# Patient Record
Sex: Female | Born: 1985 | Race: White | Hispanic: No | Marital: Married | Smoking: Never smoker
Health system: Southern US, Community
[De-identification: ages and names within clinical notes are randomized; demographics above are authoritative.]

## PROBLEM LIST (undated history)

## (undated) DIAGNOSIS — F419 Anxiety disorder, unspecified: Secondary | ICD-10-CM

## (undated) DIAGNOSIS — R41840 Attention and concentration deficit: Secondary | ICD-10-CM

## (undated) DIAGNOSIS — D696 Thrombocytopenia, unspecified: Secondary | ICD-10-CM

## (undated) DIAGNOSIS — B019 Varicella without complication: Secondary | ICD-10-CM

## (undated) DIAGNOSIS — J45909 Unspecified asthma, uncomplicated: Secondary | ICD-10-CM

## (undated) DIAGNOSIS — R61 Generalized hyperhidrosis: Secondary | ICD-10-CM

## (undated) HISTORY — DX: Attention and concentration deficit: R41.840

## (undated) HISTORY — DX: Varicella without complication: B01.9

## (undated) HISTORY — DX: Generalized hyperhidrosis: R61

## (undated) HISTORY — DX: Unspecified asthma, uncomplicated: J45.909

---

## 2009-04-19 ENCOUNTER — Ambulatory Visit: Payer: Self-pay | Admitting: Internal Medicine

## 2009-05-09 ENCOUNTER — Ambulatory Visit: Payer: Self-pay | Admitting: Internal Medicine

## 2009-05-20 ENCOUNTER — Ambulatory Visit: Payer: Self-pay | Admitting: Internal Medicine

## 2009-11-17 ENCOUNTER — Ambulatory Visit: Payer: Self-pay | Admitting: Internal Medicine

## 2010-12-06 ENCOUNTER — Emergency Department: Payer: Self-pay | Admitting: Emergency Medicine

## 2013-10-29 ENCOUNTER — Observation Stay: Payer: Self-pay

## 2013-10-29 LAB — URINALYSIS, COMPLETE
Bilirubin,UR: NEGATIVE
Ph: 6 (ref 4.5–8.0)
RBC,UR: 1 /HPF (ref 0–5)
Specific Gravity: 1.018 (ref 1.003–1.030)
Squamous Epithelial: 8

## 2013-10-29 LAB — CBC WITH DIFFERENTIAL/PLATELET
Basophil #: 0 10*3/uL (ref 0.0–0.1)
HCT: 34.8 % — ABNORMAL LOW (ref 35.0–47.0)
HGB: 12.1 g/dL (ref 12.0–16.0)
Lymphocyte #: 0.6 10*3/uL — ABNORMAL LOW (ref 1.0–3.6)
Lymphocyte %: 3.6 %
MCH: 29.6 pg (ref 26.0–34.0)
MCHC: 34.8 g/dL (ref 32.0–36.0)
Monocyte #: 0.6 x10 3/mm (ref 0.2–0.9)
Neutrophil #: 14.4 10*3/uL — ABNORMAL HIGH (ref 1.4–6.5)
RBC: 4.08 10*6/uL (ref 3.80–5.20)
RDW: 13.4 % (ref 11.5–14.5)

## 2013-10-29 LAB — ELECTROLYTE PANEL
Anion Gap: 8 (ref 7–16)
Chloride: 107 mmol/L (ref 98–107)
Potassium: 4.1 mmol/L (ref 3.5–5.1)

## 2013-11-02 ENCOUNTER — Inpatient Hospital Stay: Payer: Self-pay | Admitting: Obstetrics and Gynecology

## 2013-11-02 LAB — CBC WITH DIFFERENTIAL/PLATELET
Basophil #: 0 10*3/uL (ref 0.0–0.1)
Basophil %: 0.2 %
Eosinophil #: 0 10*3/uL (ref 0.0–0.7)
Eosinophil %: 0.3 %
HCT: 33.4 % — ABNORMAL LOW (ref 35.0–47.0)
HGB: 11.7 g/dL — ABNORMAL LOW (ref 12.0–16.0)
Lymphocyte #: 1.1 10*3/uL (ref 1.0–3.6)
MCH: 29.5 pg (ref 26.0–34.0)
MCHC: 35.1 g/dL (ref 32.0–36.0)
Monocyte %: 4.4 %
Neutrophil #: 10 10*3/uL — ABNORMAL HIGH (ref 1.4–6.5)
Neutrophil %: 86.1 %
RDW: 13.6 % (ref 11.5–14.5)
WBC: 11.7 10*3/uL — ABNORMAL HIGH (ref 3.6–11.0)

## 2013-11-02 LAB — PROTEIN / CREATININE RATIO, URINE
Creatinine, Urine: 293.5 mg/dL — ABNORMAL HIGH (ref 30.0–125.0)
Protein, Random Urine: 33 mg/dL — ABNORMAL HIGH (ref 0–12)
Protein/Creat. Ratio: 112 mg/gCREAT (ref 0–200)

## 2013-11-02 LAB — GC/CHLAMYDIA PROBE AMP

## 2013-11-03 LAB — PLATELET COUNT: Platelet: 92 10*3/uL — ABNORMAL LOW (ref 150–440)

## 2013-11-21 ENCOUNTER — Ambulatory Visit: Payer: Self-pay | Admitting: Internal Medicine

## 2013-11-22 ENCOUNTER — Ambulatory Visit: Payer: Self-pay | Admitting: Internal Medicine

## 2013-11-22 LAB — CBC CANCER CENTER
Basophil #: 0 x10 3/mm (ref 0.0–0.1)
Eosinophil #: 0.2 x10 3/mm (ref 0.0–0.7)
Eosinophil %: 3.3 %
HCT: 38.2 % (ref 35.0–47.0)
HGB: 12.5 g/dL (ref 12.0–16.0)
Lymphocyte #: 1.6 x10 3/mm (ref 1.0–3.6)
Lymphocyte %: 27.8 %
MCHC: 32.8 g/dL (ref 32.0–36.0)
Monocyte #: 0.4 x10 3/mm (ref 0.2–0.9)
Monocyte %: 6.1 %
Neutrophil #: 3.6 x10 3/mm (ref 1.4–6.5)
Platelet: 182 x10 3/mm (ref 150–440)
RDW: 13.1 % (ref 11.5–14.5)

## 2013-11-22 LAB — FERRITIN: Ferritin (ARMC): 33 ng/mL (ref 8–388)

## 2013-12-20 ENCOUNTER — Ambulatory Visit: Payer: Self-pay | Admitting: Internal Medicine

## 2015-04-11 NOTE — Op Note (Signed)
PATIENT NAME:  Natasha Yang, [name] MR#:  [id number] DATE OF BIRTH:  11-27-1986  DATE OF PROCEDURE:  11/03/2013  PREOPERATIVE DIAGNOSES:  1.  Term gestation. 2.  Thrombocytopenia. 3.  Unsuccessful induction with failure to progress.   POSTOPERATIVE DIAGNOSES: 1.  Term gestation. 2.  Thrombocytopenia. 3.  Unsuccessful induction with failure to progress.   PROCEDURE: Primary low transverse cesarean section.   SURGEON: Ricky L. Logan BoresEvans, MD  ASSISTANYetta Barre: Jones  ANESTHESIA: General by Dr. Henrene HawkingKephart  FINDINGS: Unremarkable abdomen, pelvis. Vigorous infant. Apgars 9 and 9, weight 7 pounds 7 ounces.   ESTIMATED BLOOD LOSS: 600.   COMPLICATIONS: None.   DRAINS: Foley.  On-Q pain pump placed.   Preoperative antibiotics given, 2 grams Ancef.   PROCEDURE IN DETAIL: After diagnosed and failure to progress discussed with patient and family, discussed situation and recommended cesarean section; pt and family stated understanding and desired to proceed. Consent signed. Foley catheter was placed.  DESCRIPTION OF PROCEDURE:  The patient was taken to the Operating Room and placed in the supine position, prepped and draped in the usual sterile fashion. After positioning and on okay from Anesthesia, on establishment of general anesthesia. Primary low transverse cesarean section was carried out in the usual fashion. Chromic interlocking on the uterus, rectus muscle hemostatic. The fascia was closed left to midline, On-Q pump catheters placed, hooked under the closed portion of the fascia. Remainder of the fascia was closed with simple running 0 Vicryl. Subcu was made  hemostatic with cautery. On-Q pump was then removed until the third hash mark was visualized. At this point,  the skin was closed with absorbable clips, and On-Q catheters were fixed in the usual fashion using skin glue, Steri-Strips and Tegaderm. Catheters were bolused with 5 mL of 0.5% Sensorcaine bilaterally, and dressing was applied. The  patient tolerated the procedure well. All instrument, needle, and sponge counts were correct. Anticipated routine postoperative course.    ____________________________ Reatha Harpsicky L. Logan BoresEvans, MD rle:mr D: 11/03/2013 20:25:29 ET T: 11/03/2013 22:01:46 ET JOB#: [id number]  cc: Clide Clifficky L. Logan BoresEvans, MD, <Dictator> Augustina MoodICK L Meridee Branum MD ELECTRONICALLY SIGNED 11/05/2013 12:08

## 2015-04-29 NOTE — H&P (Signed)
L&D Evaluation:  History:  HPI 29 yo G1P0 with LMP of 01/22/13 & EDD 10/30/13 with [facility]13 for Obesity (BMI 30), RH neg and pt just now realized that she had low platelets when she was 18. Pt had vomiting, diarrhea and fever yest of 101.4. Today she needed IV hydration and she is feeling well now.   Presents with nausea/vomiting   Patient's Medical History Low platelets age 29, +Chlamydia   Patient's Surgical History none   Medications Pre Natal Vitamins   Allergies NKDA   Social History none   Family History Non-Contributory   ROS:  ROS All systems were reviewed.  HEENT, CNS, GI, GU, Respiratory, CV, Renal and Musculoskeletal systems were found to be normal.   Exam:  Vital Signs stable   General no apparent distress   Mental Status clear   Chest clear   Heart normal sinus rhythm, no murmur/gallop/rubs   Abdomen gravid, non-tender   Estimated Fetal Weight Average for gestational age   Edema 1+   Reflexes 1+   Clonus negative   Pelvic cervix closed and thick   Mebranes Intact   FHT normal rate with no decels, reactive NST with 2 accels 15 x 15 bpm   Ucx regular   Skin dry   Lymph no lymphadenopathy   Other PLT's 83,000   Plan:  Plan fluids, [facility]   Electronic Signatures: Sharee PimpleJones, Anayansi Rundquist W (CNM)  (Signed [phone]-Nov-14 21:02)  Authored: L&D Evaluation   Last Updated: 10-Nov-14 21:02 by Sharee PimpleJones, Emonni Depasquale W (CNM)

## 2015-04-29 NOTE — H&P (Signed)
L&D Evaluation:  History:  HPI 29 yo G1P0 with LMP of 01/22/13 & EDd of 10/30/13 [facility] significant for post-dates (40 4/7 weeks) and thrombocytopenia recently presenting this week after pt had N,V,Diarrhea for 3 days. PT HERE  for IOL due to post-dates and low platelets. No ROM, VB, decreased FM or UC's at present.   Presents with IOL for post-dates and thrombocytopenia   Patient's Medical History Obesity, Chlamydia x 1 3 yrs ago   Patient's Surgical History none   Medications Pre Natal Vitamins   Allergies NKDA   Social History none   Family History Non-Contributory   ROS:  ROS All systems were reviewed.  HEENT, CNS, GI, GU, Respiratory, CV, Renal and Musculoskeletal systems were found to be normal.   Exam:  Vital Signs stable   General no apparent distress   Mental Status clear   Chest clear   Heart normal sinus rhythm, no murmur/gallop/rubs   Abdomen gravid, non-tender, EFW 8"6   Fetal Position vtx   Back no CVAT   Edema no edema   Reflexes 1+   Clonus negative   Pelvic closed/thick   Mebranes Intact   FHT normal rate with no decels, +accels, no decels   Ucx absent   Skin dry   Lymph no lymphadenopathy   Impression:  Impression IOL for post-dates   Plan:  Plan monitor contractions and for cervical change   Electronic Signatures: Sharee PimpleJones, Neale Marzette W (CNM)  (Signed [phone]-Nov-14 16:36)  Authored: L&D Evaluation   Last Updated: 14-Nov-14 16:36 by Sharee PimpleJones, Iyania Denne W (CNM)

## 2016-03-09 ENCOUNTER — Encounter: Payer: Self-pay | Admitting: Primary Care

## 2016-03-09 ENCOUNTER — Ambulatory Visit (INDEPENDENT_AMBULATORY_CARE_PROVIDER_SITE_OTHER): Payer: Managed Care, Other (non HMO) | Admitting: Primary Care

## 2016-03-09 ENCOUNTER — Ambulatory Visit (INDEPENDENT_AMBULATORY_CARE_PROVIDER_SITE_OTHER)
Admission: RE | Admit: 2016-03-09 | Discharge: 2016-03-09 | Disposition: A | Discharge status: Home / Self Care | Payer: Managed Care, Other (non HMO) | Source: Ambulatory Visit | Attending: Primary Care | Admitting: Primary Care

## 2016-03-09 VITALS — BP 136/84 | HR 64 | Temp 97.4°F | Ht 67.0 in | Wt 219.0 lb

## 2016-03-09 DIAGNOSIS — R61 Generalized hyperhidrosis: Secondary | ICD-10-CM | POA: Insufficient documentation

## 2016-03-09 DIAGNOSIS — R41840 Attention and concentration deficit: Secondary | ICD-10-CM | POA: Diagnosis not present

## 2016-03-09 DIAGNOSIS — M25512 Pain in left shoulder: Secondary | ICD-10-CM | POA: Diagnosis not present

## 2016-03-09 DIAGNOSIS — M7989 Other specified soft tissue disorders: Secondary | ICD-10-CM | POA: Diagnosis not present

## 2016-03-09 DIAGNOSIS — L74519 Primary focal hyperhidrosis, unspecified: Secondary | ICD-10-CM | POA: Diagnosis not present

## 2016-03-09 NOTE — Assessment & Plan Note (Signed)
Diagnosed years ago, once managed [facility] with B[facility]. Clammy hands with moderate swelling to hands and joints. Will rule out any inflammatory disorders and have her trial short term NSAIDS. Labs pending.

## 2016-03-09 NOTE — Assessment & Plan Note (Signed)
Present for 6+ months. Worse with abduction and rotation. Exam with moderate decrease in ROM. Will have her see Dr. Patsy Lageropland for evaluation. Xray today, pending. Will trial short term NSAIDS.

## 2016-03-09 NOTE — Progress Notes (Signed)
Pre visit review using our clinic review tool, if applicable. No additional management support is needed unless otherwise documented below in the visit note. 

## 2016-03-09 NOTE — Patient Instructions (Signed)
Complete lab work prior to leaving today. I will notify you of your results once received.   Complete xray(s) prior to leaving today. I will notify you of your results once received.  You will be contacted regarding your referral for ADD/ADHD testing.  Please let us know if you have not heard back within one week.   Schedule an appointment with Dr. Patsy Lageropland regarding your shoulder. You may take ibuprofen 600 mg three times daily as needed for pain and inflammation.  Please schedule a physical with me within the next 3-6 months. We will do same day labs if necessary.  It was a pleasure to meet you today! Please don't hesitate to call me with any questions. Welcome to [facility]!

## 2016-03-09 NOTE — Progress Notes (Signed)
Subjective:    Patient ID: Natasha Yang, female    DOB: [birth year], 31 y.o.   MRN: [id number]  HPI  Natasha Yang is a 30 year old female who presents today to establish care and discuss the problems mentioned below. Will obtain old records.  1) Shoulder Pain: Located to left shoulder, more internal pain that is present with rotation and abduction. Her pain will be present with removing her arm from a jacket. Her pain has been present for the past 6 months with an increase in pain during the summer last year while playing softball. She was once active in crossfit that required a lot of weight bearing exercises to her shoulder. She's not taken anything OTC for her pain. Overall her pain has progressed. She has not participated in softball or crossfit since last summer. Denies recent injury or trauma.  2) Hyperhydrosis: History of and was once treated at [facility]. Her hands will become swollen and clammy. She has pain and swelling to the joints of her fingers. She was once treated 7 years ago with Botox injections with temporary improvement.   3) Asthma: Diagnosed in childhood. Sports related. She has no symptoms as an adult and does not have an inhaler.   4) ADD: Diagnosed in 2012 when starting her undergraduate studies. She was provided with a test for ADD by her PCP and was managed on a medium dose Adderall with improvement in school work and focus. She was on this medication for 2 years. She is currently in graduate school since January 2017. She's noticed difficulty organizing thoughts in her brain, difficulty organizing and participating in group projects. She is interested in medication again as she was a better student. She's not had formal testing.  Review of Systems  Respiratory: Negative for shortness of breath and wheezing.   Genitourinary:       IUD  Musculoskeletal: Positive for joint swelling and arthralgias.  Skin:       Hyperhydrosis   Psychiatric/Behavioral: Positive for  decreased concentration.       Past Medical History  Diagnosis Date  . Asthma   . History of UTI   . Chickenpox     Social History   Social History  . Marital Status: Single    Spouse Name: N/A  . Number of Children: N/A  . Years of Education: N/A   Occupational History  . Not on file.   Social History Main Topics  . Smoking status: Never Smoker   . Smokeless tobacco: Not on file  . Alcohol Use: 0.0 oz/week    0 Standard drinks or equivalent per week     Comment: soical  . Drug Use: No  . Sexual Activity: Not on file   Other Topics Concern  . Not on file   Social History Narrative   Married.   1 child.   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   Currently Landscape architect for Social Work.    Enjoys spending time with family.    Past Surgical History  Procedure Laterality Date  . Cesarean section      Family History  Problem Relation Age of Onset  . Hypertension Mother   . Hypertension Father     No Known Allergies  No current outpatient prescriptions on file prior to visit.   No current facility-administered medications on file prior to visit.    BP 136/84 mmHg  Pulse 64  Temp(Src) 97.4 F (36.3 C) (Oral)  Ht  (1.702  m)  Wt 219 lb (99.338 kg)  BMI 34.29 kg/m2  SpO2 98%    Objective:   Physical Exam  Constitutional: She appears well-nourished.  Cardiovascular: Normal rate and regular rhythm.   Pulmonary/Chest: Effort normal and breath sounds normal. She has no wheezes.  Musculoskeletal:       Left shoulder: She exhibits decreased range of motion and pain. She exhibits no tenderness and no deformity.  Skin: Skin is warm.  Clammy to bilateral hands. Moderate swelling to hands and joints. 2+ radial pulses.  Psychiatric: She has a normal mood and affect.          Assessment & Plan:

## 2016-03-09 NOTE — Assessment & Plan Note (Signed)
Endorses history of ADD year ago. Managed on Adderall in the past with improvement. Will have her complete formal testing prior to re-initiation of meds. Referral placed.

## 2016-03-10 ENCOUNTER — Telehealth: Payer: Self-pay | Admitting: Primary Care

## 2016-03-10 LAB — RHEUMATOID FACTOR: Rhuematoid fact SerPl-aCnc: <10 IU/mL (ref <=14)

## 2016-03-10 LAB — ANA: Anti Nuclear Antibody(ANA): NEGATIVE

## 2016-03-10 LAB — SEDIMENTATION RATE: Sed Rate: 11 mm/hr (ref 0–22)

## 2016-03-10 NOTE — Telephone Encounter (Signed)
Pt called. Forgot to ask a what else she can do swelling in her hands. She knows you are waiting on labs but in the meantime what should she do? Is there anything else we can look into? Please advise

## 2016-03-10 NOTE — Telephone Encounter (Signed)
[facility] to be schedule with Dr. Reggy EyeAltabet or Dr. Jason FilaBray for formal ADD testing. Pt is aware their office will contact her to schedule.

## 2016-03-11 ENCOUNTER — Encounter: Payer: Self-pay | Admitting: Primary Care

## 2016-03-11 NOTE — Telephone Encounter (Signed)
Pt did view Kate's instructions on Mychart and I called and left voicemail also letting pt know Kate's comments and if they don't work to let us know because the next step would be HCTZ

## 2016-03-11 NOTE — Telephone Encounter (Signed)
I've sent the results of her recent testing to her My Chart account, she is negative for any inflammatory or arthritic disorders. Other than increasing consumption of water, decreasing salt consumption, and weight loss, we could try a low dose fluid pill called hydrochlorothiazide to help.  I would recommend she try the other suggestions first.

## 2016-03-13 ENCOUNTER — Other Ambulatory Visit: Payer: Self-pay | Admitting: Primary Care

## 2016-03-13 DIAGNOSIS — M7989 Other specified soft tissue disorders: Secondary | ICD-10-CM

## 2016-03-13 MED ORDER — HYDROCHLOROTHIAZIDE 12.5 MG PO CAPS: 12.5000 mg | ORAL_CAPSULE | Freq: Every day | ORAL | Status: DC

## 2016-03-17 ENCOUNTER — Ambulatory Visit (INDEPENDENT_AMBULATORY_CARE_PROVIDER_SITE_OTHER): Payer: Managed Care, Other (non HMO) | Admitting: Family Medicine

## 2016-03-17 ENCOUNTER — Encounter: Payer: Self-pay | Admitting: Family Medicine

## 2016-03-17 VITALS — BP 110/78 | HR 62 | Temp 98.2°F | Ht 67.0 in | Wt 222.0 lb

## 2016-03-17 DIAGNOSIS — M24512 Contracture, left shoulder: Secondary | ICD-10-CM

## 2016-03-17 DIAGNOSIS — M7582 Other shoulder lesions, left shoulder: Secondary | ICD-10-CM | POA: Diagnosis not present

## 2016-03-17 DIAGNOSIS — M25812 Other specified joint disorders, left shoulder: Secondary | ICD-10-CM

## 2016-03-17 DIAGNOSIS — M7522 Bicipital tendinitis, left shoulder: Secondary | ICD-10-CM | POA: Diagnosis not present

## 2016-03-17 NOTE — Progress Notes (Signed)
Dr. Karleen HampshireSpencer T. Jemima Petko, MD, CAQ Sports Medicine Primary Care and Sports Medicine [address] [place], [id number] Phone: [phone] Fax: [phone]  03/17/2016  Patient: Natasha Yang, MRN: [id number], DOB: 12/05/1986, [age] y.o.  Primary Physician:  Morrie Sheldonlark,Katherine Kendal, NP   Chief Complaint  Patient presents with  . Shoulder Pain    Left   Subjective:   Natasha DollyKelsey M Buchta is a 30 y.o. very pleasant female patient who presents with the following:  Consult: Clark  Pain last summer with playing softball. Hurts with softball. Moves certain ways and it will hurt a lot. If sitting here it will hurt a lot. Cannot sleep on it. Cannot bat. Only the left side.   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  Did crossfit for about 4 years. As far as she can remember,  She had no specific injury when she was doing CrossFit. No acute injury.   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 No lifting at work.  No repetitive motion that she would do at work.  She has never had a prior dislocation or subluxation.  No prior operations.  I reviewed her plain shoulder films face-to-face with the patient in the office.  They're essentially unremarkable.  There is no glenohumeral or acromioclavicular osteoarthritic change.  There is no evidence for prior trauma or prior fracture.  The humeral head is well located.  Past Medical History, Surgical History, Social History, Family History, Problem List, Medications, and Allergies have been reviewed and updated if relevant.  Patient Active Problem List   Diagnosis Date Noted  . Pain in joint of left shoulder 03/09/2016  . Difficulty concentrating 03/09/2016  . 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 Asthma   . Hyperhydrosis disorder   . Chickenpox   . Difficulty concentrating     Past Surgical History    Procedure Laterality Date  . Cesarean section      Social History   Social History  . Marital Status: Single    Spouse Name: N/A  . Number of Children: N/A  . Years of Education: N/A   Occupational History  . Not on file.   Social History Main Topics  . Smoking status: Never Smoker   . Smokeless tobacco: Never Used  . Alcohol Use: 0.0 oz/week    0 Standard drinks or equivalent per week     Comment: soical  . Drug Use: No  . Sexual Activity: Not on file   Other Topics Concern  . Not on file   Social History Narrative   Married.   1 child.   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   Currently Landscape architectgetting Masters for Social Work.    Enjoys spending time with family.    Family History  Problem Relation Age of Onset  . Hypertension Mother   . Hypertension Father     No Known Allergies  Medication list reviewed and updated in full in [facility].  GEN: No fevers, chills. Nontoxic. Primarily MSK c/o today. MSK: Detailed in the HPI GI: tolerating PO intake without difficulty Neuro: No numbness, parasthesias, or tingling associated. Otherwise the pertinent positives of the ROS are noted above.   Objective:   BP 110/78 mmHg  Pulse 62  Temp(Src) 98.2 F (36.8 C) (Oral)  Ht 5\' 7"  (1.702 m)  Wt 222 lb (100.699 kg)  BMI 34.76 kg/m2   GEN: WDWN, NAD, Non-toxic, Alert & Oriented x 3 HEENT: Atraumatic, Normocephalic.  Ears and Nose: No external deformity. EXTR: No clubbing/cyanosis/edema NEURO: Normal gait.  PSYCH: Normally interactive. Conversant. Not depressed or anxious appearing.  Calm demeanor.   Shoulder: L Inspection: No muscle wasting or winging Ecchymosis/edema: neg  AC joint, scapula, clavicle: NT Cervical spine: NT, full ROM Spurling's: neg Abduction: full, 5/5 Flexion: full, 5/5 IR, full, lift-off: 5/5 - at 90 deg of ABD, minimal to no IROM. 70 deg difference compared to R ER at neutral: full, 5/5 AC crossover and compression:  neg Neer: pos Hawkins: pos Drop Test: neg Empty Can: neg Supraspinatus insertion: NT Bicipital groove: TTP Speed's: pos Yergason's: neg Sulcus sign: neg Scapular dyskinesis: none C5-T1 intact Sensation intact Grip 5/5   Radiology: Dg Shoulder Left  03/10/2016  CLINICAL DATA:  Left shoulder pain for 6 months EXAM: LEFT SHOULDER - 2+ VIEW COMPARISON:  None. FINDINGS: There is no evidence of fracture or dislocation. There is no evidence of arthropathy or other focal bone abnormality. Soft tissues are unremarkable. IMPRESSION: Negative. Electronically Signed   By: Esperanza Heir M.D.   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  Nevertheless, these generally make each other worse significantly.  It is almost impossible to have normal shoulder motion and be without pain without at least some functional  Internal range of motion.  I gave her a posterior capsular program from [facility] and we are going to get her into formal physical therapy.  At age 69, I would expect that she will do well.  If she ultimately does poorly, other pathology such as labral tear cannot fully be excluded, and a MR arthrogram would be reasonable if the patient is amenable or would ever consider operative solution.  Follow-up: f/u 6 weeks  Orders Placed This Encounter  Procedures  .  Ambulatory referral to Physical Therapy    Signed,  Karleen Hampshire T. Mansel Strother, MD   Patient's Medications  New Prescriptions   No medications on file  Previous Medications   HYDROCHLOROTHIAZIDE (MICROZIDE) 12.5 MG CAPSULE    Take 1 capsule (12.5 mg total) by mouth daily.  Modified Medications   No medications on file  Discontinued Medications   No medications on file

## 2016-03-17 NOTE — Progress Notes (Signed)
Pre visit review using our clinic review tool, if applicable. No additional management support is needed unless otherwise documented below in the visit note. 

## 2016-04-06 ENCOUNTER — Telehealth: Payer: Managed Care, Other (non HMO) | Admitting: Family

## 2016-04-06 DIAGNOSIS — J309 Allergic rhinitis, unspecified: Secondary | ICD-10-CM

## 2016-04-06 MED ORDER — BENZONATATE 100 MG PO CAPS: 200.0000 mg | ORAL_CAPSULE | Freq: Three times a day (TID) | ORAL | Status: DC

## 2016-04-06 MED ORDER — AZITHROMYCIN 250 MG PO TABS: ORAL_TABLET | ORAL | Status: DC

## 2016-04-06 NOTE — Addendum Note (Signed)
Addended by: Worthy RancherWEBB, Carrell Palmatier B on: 04/06/2016 09:45 AM   Modules accepted: Orders

## 2016-04-06 NOTE — Addendum Note (Signed)
Addended by: Worthy RancherWEBB, Garmon Dehn B on: 04/06/2016 10:19 AM   Modules accepted: Orders

## 2016-04-06 NOTE — Progress Notes (Signed)
Although I do not this you need a zpak, I will send it. Zpaks are for bacterial infections not colds and virus. You have an allergy issue that has cased inflammation. You are welcome to take the Zpak but it does not cover colds. I will do a note as well.

## 2016-04-06 NOTE — Progress Notes (Signed)
E visit for Allergic Rhinitis We are sorry that you are not feeling well.  Her is how we plan to help!  Based on what you have shared with me it looks like you have Allergic Rhinitis.  Rhinitis is when a reaction occurs that causes nasal congestion, runny nose, sneezing, and itching.  Most types of rhinitis are caused by an inflammation and are associated with symptoms in the eyes ears or throat. There are several types of rhinitis.  The most common are acute rhinitis, which is usually caused by a viral illness, allergic or seasonal rhinitis, and nonallergic or year-round rhinitis.  Nasal allergies occur certain times of the year.  Allergic rhinitis is caused when allergens in the air trigger the release of histamine in the body.  Histamine causes itching, swelling, and fluid to build up in the fragile linings of the nasal passages, sinuses and eyelids.  An itchy nose and clear discharge are common.  I recommend the following over the counter treatments: You should take a daily dose of antihistamine  I also would recommend a nasal spray: Flonase 2 sprays into each nostril once daily. Both of these medications you can get over the counter at the pharmacy.     HOME CARE:   You can use an over-the-counter saline nasal spray as needed  Avoid areas where there is heavy dust, mites, or molds  Stay indoors on windy days during the pollen season  Keep windows closed in home, at least in bedroom; use air conditioner.  Use high-efficiency house air filter  Keep windows closed in car, turn AC on re-circulate  Avoid playing out with dog during pollen season  GET HELP RIGHT AWAY IF:   If your symptoms do not improve within 10 days  You become short of breath  You develop yellow or green discharge from your nose for over 3 days  You have coughing fits  MAKE SURE YOU:   Understand these instructions  Will watch your condition  Will get help right away if you are not doing well or  get worse  Thank you for choosing an e-visit. Your e-visit answers were reviewed by a board certified advanced clinical practitioner to complete your personal care plan. Depending upon the condition, your plan could have included both over the counter or prescription medications. Please review your pharmacy choice. Be sure that the pharmacy you have chosen is open so that you can pick up your prescription now.  If there is a problem you may message your provider in MyChart to have the prescription routed to another pharmacy. Your safety is important to us. If you have drug allergies check your prescription carefully.  For the next 24 hours, you can use MyChart to ask questions about today's visit, request a non-urgent call back, or ask for a work or school excuse from your e-visit provider. You will get an email in the next two days asking about your experience. I hope that your e-visit has been valuable and will speed your recovery.

## 2016-04-15 ENCOUNTER — Other Ambulatory Visit: Payer: Self-pay | Admitting: Primary Care

## 2016-04-15 NOTE — Telephone Encounter (Signed)
Electronically refill request for   hydrochlorothiazide (MICROZIDE) 12.5 MG capsule   Take 1 capsule (12.5 mg total) by mouth daily.  Dispense: 30 capsule   Refills: 0     Last prescribed on 03/13/2016. Last seen on 03/17/2016 for acute with Dr Patsy Lageropland. No future appointment.

## 2016-04-16 NOTE — Telephone Encounter (Signed)
Message left for patient to return my call.  

## 2016-04-20 ENCOUNTER — Telehealth: Payer: Self-pay

## 2016-04-20 NOTE — Telephone Encounter (Signed)
Pt left v/m; pt saw Dr Patsy Lageropland 03/17/16 and was referred to [facility]; Vonna KotykJay the physical therapist is not seeing much progress and was not sure if pt should be seen by Dr Patsy Lageropland or have referrral for pt to see orthopedist. Pt request cb.

## 2016-04-21 NOTE — Telephone Encounter (Signed)
Left message for Adelina MingsKelsey to return my call.

## 2016-04-21 NOTE — Telephone Encounter (Signed)
i had originally planned on her following up with me 6 weeks after I saw her. (ie. In the next week or so)  Typically, I would see my own patients to reevaluate them. If she would like to see a different physician, then this is always ok.

## 2016-04-21 NOTE — Telephone Encounter (Signed)
Pt returned you r call - please call at [phone] thanks

## 2016-04-21 NOTE — Telephone Encounter (Signed)
Lenox notified as instructed by telephone.  She wants to follow up with Dr. Patsy Lageropland.  Appointment scheduled for 04/29/2016 at 3:15 pm.

## 2016-04-29 ENCOUNTER — Ambulatory Visit (INDEPENDENT_AMBULATORY_CARE_PROVIDER_SITE_OTHER): Payer: Managed Care, Other (non HMO) | Admitting: Family Medicine

## 2016-04-29 ENCOUNTER — Encounter: Payer: Self-pay | Admitting: Family Medicine

## 2016-04-29 VITALS — BP 120/84 | HR 61 | Temp 98.3°F | Ht 67.0 in | Wt 225.2 lb

## 2016-04-29 DIAGNOSIS — L989 Disorder of the skin and subcutaneous tissue, unspecified: Secondary | ICD-10-CM | POA: Diagnosis not present

## 2016-04-29 DIAGNOSIS — M25512 Pain in left shoulder: Secondary | ICD-10-CM | POA: Diagnosis not present

## 2016-04-29 NOTE — Progress Notes (Signed)
Pre visit review using our clinic review tool, if applicable. No additional management support is needed unless otherwise documented below in the visit note. 

## 2016-04-29 NOTE — Progress Notes (Signed)
Dr. Karleen Hampshire T. Tylasia Fletchall, MD, CAQ Sports Medicine [facility] [address] [place], [address] Phone: [phone] Fax: [phone]  04/29/2016  Patient: Natasha Yang, MRN: [id number], DOB: [birth year], 30 y.o.  Primary Physician:  Morrie Sheldon, NP   Chief Complaint  Patient presents with  . Follow-up    Left shoulder   Subjective:   Natasha Yang is a 30 y.o. very pleasant female patient who presents with the following:  Pleasant patient who I remember well who presents with follow-up regarding left-sided shoulder pain.  On her last office visit details of her history are below, and at that point I thought that she was having some significant impingement and she also had some  GIRD (Glenohumeral Internal Rotational Deficiency).  At this point, on further questioning she also relates a history where she generally feels as if her shoulder is fine most of the time but there are times on specific movements where she feels as if she has a severe, 10 out of 10 pain, as if a knife is stabbing her in the shoulder.  It specifically hurts her in external range of motion and also when she is reaching across the body.  She has been doing formal physical therapy and rehabilitation on her own, and she does not feel like she is any better whatsoever.  She also has been doing oral anti-inflammatories.  No this is made any difference.  Electrical stimulation will help for a short amount of time, within the pain will return.  03/17/2016 Last OV with Hannah Beat, MD  Consult: Chestine Spore  Pain last summer with playing softball. Hurts with softball. Moves certain ways and it will hurt a lot. If sitting here it will hurt a lot. Cannot sleep on it. Cannot bat. Only the left side.   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  Did crossfit for about 4 years. As far as she can remember,   She had no specific injury when she was doing CrossFit. No acute injury.   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 No lifting at work.  No repetitive motion that she would do at work.  She has never had a prior dislocation or subluxation.  No prior operations.  I reviewed her plain shoulder films face-to-face with the patient in the office.  They're essentially unremarkable.  There is no glenohumeral or acromioclavicular osteoarthritic change.  There is no evidence for prior trauma or prior fracture.  The humeral head is well located.  Past Medical History, Surgical History, Social History, Family History, Problem List, Medications, and Allergies have been reviewed and updated if relevant.  Patient Active Problem List   Diagnosis Date Noted  . Pain in joint of left shoulder 03/09/2016  . Difficulty concentrating 03/09/2016  . 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 Asthma   . Hyperhydrosis disorder   . Chickenpox   . Difficulty concentrating     Past Surgical History  Procedure Laterality Date  . Cesarean section      Social History   Social History  . Marital Status: Single    Spouse Name: N/A  . Number of Children: N/A  . Years of Education: N/A   Occupational History  . Not on file.   Social History Main Topics  . Smoking status: Never Smoker   .  Smokeless tobacco: Never Used  . Alcohol Use: 0.0 oz/week    0 Standard drinks or equivalent per week     Comment: soical  . Drug Use: No  . Sexual Activity: Not on file   Other Topics Concern  . Not on file   Social History Narrative   Married.   1 child.   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   Currently Landscape architect for Social Work.    Enjoys spending time with family.    Family History  Problem Relation Age of Onset  . Hypertension Mother   . Hypertension Father     No Known Allergies  Medication list reviewed and updated in full in [facility].  GEN: No fevers, chills. Nontoxic. Primarily MSK c/o today. MSK: Detailed in the HPI GI: tolerating PO intake without difficulty Neuro: No numbness, parasthesias, or tingling associated. Otherwise the pertinent positives of the ROS are noted above.   Objective:   BP 120/84 mmHg  Pulse 61  Temp(Src) 98.3 F (36.8 C) (Oral)  Ht  (1.702 m)  Wt 225 lb 4 oz (102.173 kg)  BMI 35.27 kg/m2   GEN: WDWN, NAD, Non-toxic, Alert & Oriented x 3 HEENT: Atraumatic, Normocephalic.  Ears and Nose: No external deformity. EXTR: No clubbing/cyanosis/edema NEURO: Normal gait.  PSYCH: Normally interactive. Conversant. Not depressed or anxious appearing.  Calm demeanor.   Shoulder: L Inspection: No muscle wasting or winging Ecchymosis/edema: neg  AC joint, scapula, clavicle: NT Cervical spine: NT, full ROM Spurling's: neg Abduction: full, 5/5 Flexion: full, 5/5 IR, full, lift-off: 5/5 - at 90 deg of ABD, minimal to no IROM. 40 deg difference compared to R ER at neutral: full, 3+/5 AC crossover and compression: neg Neer: neg Hawkins: mild pos Drop Test: neg Empty Can: neg Supraspinatus insertion: NT Bicipital groove: TTP Speed's: pos Yergason's: neg Sulcus sign: neg O'BRIENS MARKEDLY POSITIVE JERK TEST POSITIVE Scapular dyskinesis: none C5-T1 intact Sensation intact Grip 5/5   Radiology: Dg Shoulder Left  03/10/2016  CLINICAL DATA:  Left shoulder pain for 6 months EXAM: LEFT SHOULDER - 2+ VIEW COMPARISON:  None. FINDINGS: There is no evidence of fracture or dislocation. There is no evidence of arthropathy or other focal bone abnormality. Soft tissues are unremarkable. IMPRESSION: Negative. Electronically Signed   By: Esperanza Heir M.D.   On: 03/10/2016 08:11   Assessment and Plan:   Left shoulder pain - Plan: DG FLUORO GUIDED NEEDLE PLC ASPIRATION/INJECTION LOC, Ambulatory referral to Orthopedic Surgery, MR Shoulder Left W Contrast, CANCELED: MR Shoulder Left Wo  Contrast  Skin lesion - Plan: Ambulatory referral to Dermatology  I'm convinced that the patient has got significant shoulder pathology.  Notable weakness in external rotation, cannot exclude rotator cuff tear.  With a dramatically positive O'Brien's test and a positive jerk test, labral tear seems to be the most likely pathology given physical examination and history.  Obtain an MR arthrogram of the left shoulder to evaluate for labral tear,  Capsular integrity, rotator cuff tear.  Failure of conservative management thus far for multiple months.  Given my high level of clinical concern, I'm going to have her go ahead and follow-up with  Dr. Ave Filter for his expertise.  I appreciate his assistance.  Orders Placed This Encounter  Procedures  . DG FLUORO GUIDED NEEDLE PLC ASPIRATION/INJECTION LOC  . MR Shoulder Left W Contrast  . Ambulatory referral to Dermatology  . Ambulatory referral to Orthopedic Surgery    Signed,  Karleen Hampshire  Ward Chatters. Raynesha Tiedt, MD   Patient's Medications  New Prescriptions   No medications on file  Previous Medications   HYDROCHLOROTHIAZIDE (MICROZIDE) 12.5 MG CAPSULE    TAKE 1 CAPSULE (12.5 MG TOTAL) BY MOUTH DAILY.  Modified Medications   No medications on file  Discontinued Medications   AZITHROMYCIN (ZITHROMAX) 250 MG TABLET    As directed (zpak)   BENZONATATE (TESSALON PERLES) 100 MG CAPSULE    Take 2 capsules (200 mg total) by mouth 3 (three) times daily.

## 2016-04-29 NOTE — Patient Instructions (Signed)

## 2016-05-07 ENCOUNTER — Ambulatory Visit
Admission: RE | Admit: 2016-05-07 | Discharge: 2016-05-07 | Disposition: A | Discharge status: Home / Self Care | Payer: Managed Care, Other (non HMO) | Source: Ambulatory Visit | Attending: Family Medicine | Admitting: Family Medicine

## 2016-05-07 DIAGNOSIS — M25512 Pain in left shoulder: Secondary | ICD-10-CM

## 2016-05-07 MED ORDER — IOPAMIDOL (ISOVUE-M 200) INJECTION 41%
18.0000 mL | Freq: Once | INTRAMUSCULAR | Status: AC
  Administered 2016-05-07: 18 mL via INTRA_ARTICULAR

## 2016-05-08 ENCOUNTER — Other Ambulatory Visit: Payer: Self-pay | Admitting: Primary Care

## 2016-05-10 NOTE — Telephone Encounter (Signed)
Yes, have her try taking 2 tablets for 2 weeks and call back with an update. Please have her e-mail me if she develops dizziness, lightheadedness, weakness.

## 2016-05-10 NOTE — Telephone Encounter (Signed)
Also have her monitor her BP if possible and notify me of readings below 100/60.

## 2016-05-10 NOTE — Telephone Encounter (Signed)
Electronically refill request for   hydrochlorothiazide (MICROZIDE) 12.5 MG capsule   TAKE 1 CAPSULE (12.5 MG TOTAL) BY MOUTH DAILY.  Dispense: 30 capsule   Refills: 0     Last prescribed on 04/15/2016. Last seen on 04/29/2016. Patient did not call back to update regarding swelling. No future appt

## 2016-05-10 NOTE — Telephone Encounter (Signed)
Spoken to patient and she stated that she did not send a request for refill. The prescription must be on auto refill. Patient stop taking it a while back when she did not see any differences on the swelling. Patient stated no, she have tried glycopyrrolate. Patient wanted to know if should increase the dosage of the HCTZ since noticed no change with 12.5 mg.

## 2016-05-11 NOTE — Telephone Encounter (Signed)
Spoken and notified patient of Kate's comments. Patient verbalized understanding. 

## 2016-05-11 NOTE — Telephone Encounter (Signed)
Message left for patient to return my call.  

## 2016-05-16 ENCOUNTER — Encounter: Payer: Self-pay | Admitting: Primary Care

## 2016-05-24 ENCOUNTER — Telehealth: Payer: Self-pay | Admitting: Primary Care

## 2016-05-24 NOTE — Telephone Encounter (Signed)
Message left for patient to return my call.  

## 2016-05-24 NOTE — Telephone Encounter (Signed)
-----   Message from Doreene NestKatherine K Calan Doren, NP sent at 05/10/2016  8:00 PM EDT ----- Regarding: Swelling Will you please check on Natasha Yang and her hand swelling. Any improvement on HCTZ, 2 tablets?

## 2016-05-25 ENCOUNTER — Encounter: Payer: Self-pay | Admitting: Primary Care

## 2016-05-25 NOTE — Telephone Encounter (Signed)
Patient send a MyChart message and it stated:  To be honest with you, I am not really noticing a difference. I typically take them in the morning....and for example, over this weekend by the evening time, my hands were swollen even with me taking the pills in the morning. Not sure if they 'wear' off? I havent really noticed a difference. & like I told the Doctor- I had tried water pills in the past (over the counter) and they never seemed to work. I notice that the swelling is intensified when I am hot and my hands are sweating. But over this weekend, they were severe and painful.

## 2016-05-25 NOTE — Telephone Encounter (Signed)
Attempted to call patient to discuss symptoms in greater detail. No answer, left voice mail message for her to return my call.

## 2016-06-03 ENCOUNTER — Other Ambulatory Visit: Payer: Self-pay | Admitting: Primary Care

## 2016-06-03 DIAGNOSIS — R61 Generalized hyperhidrosis: Secondary | ICD-10-CM

## 2016-06-10 ENCOUNTER — Other Ambulatory Visit: Payer: Self-pay | Admitting: Primary Care

## 2016-06-10 DIAGNOSIS — L74512 Primary focal hyperhidrosis, palms: Secondary | ICD-10-CM

## 2016-06-10 DIAGNOSIS — R61 Generalized hyperhidrosis: Secondary | ICD-10-CM

## 2016-08-01 IMAGING — MR MR SHOULDER*L* W/ CM
6 series · 38 of 40 positions shown · non-contrast
Comparison: 03/09/2016

CLINICAL DATA: Left shoulder pain for greater than 1 year.

EXAM:
MR ARTHROGRAM OF THE left SHOULDER
TECHNIQUE: Multiplanar, multisequence MR imaging of the left shoulder was
performed following the administration of intra-articular contrast.

[Series 3: T1 fat-sat · axial · 4.0mm · 0.23mm/px · z∈[-64,+21]mm · 9 of 20 slices shown (1 of 3)]
[im 1/20]
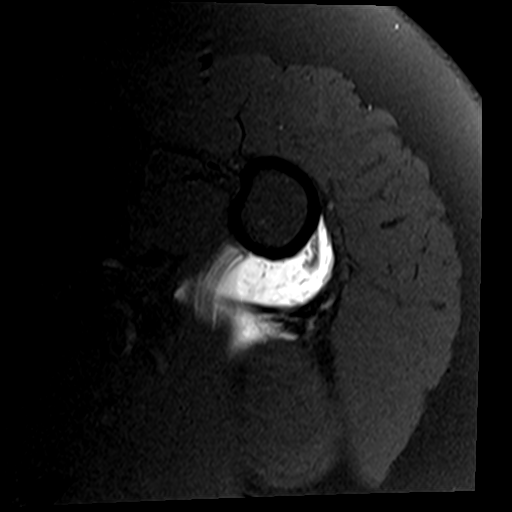
[im 3/20]
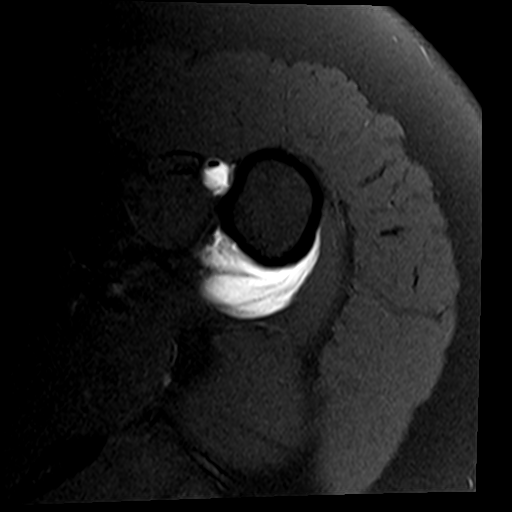
[im 5/20]
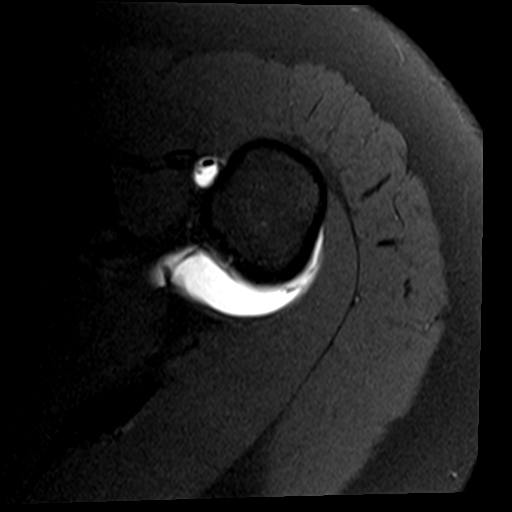
[im 8/20]
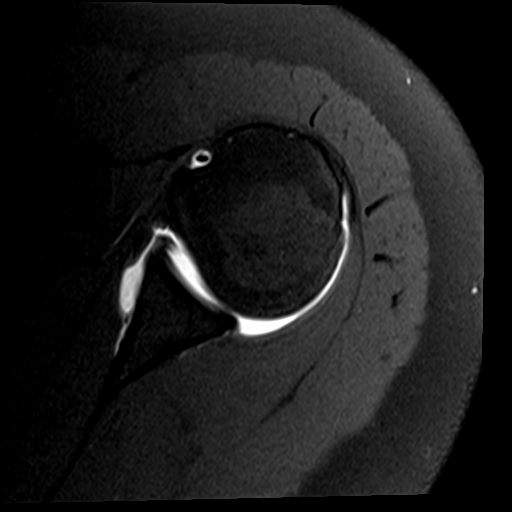
[im 10/20]
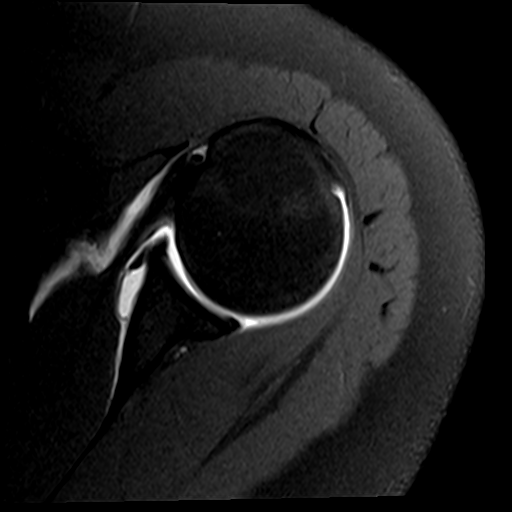
[im 12/20]
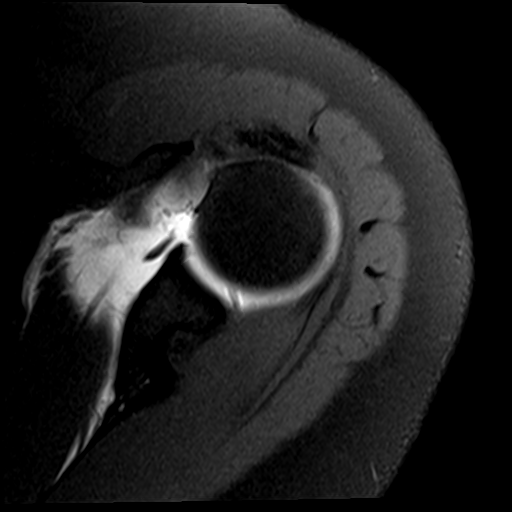
[im 15/20]
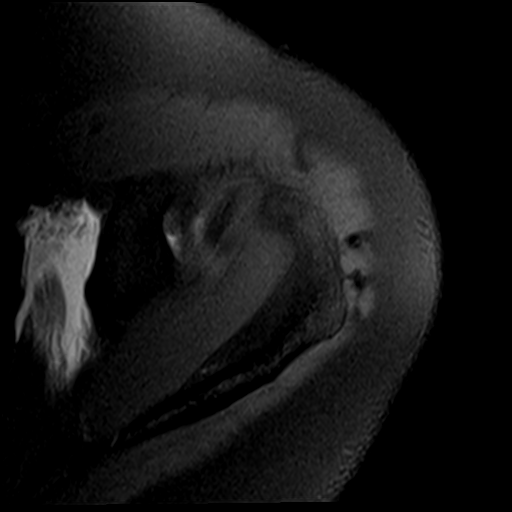
[im 17/20]
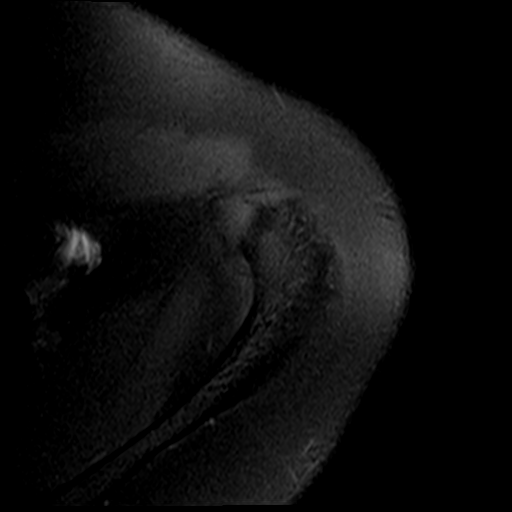
[im 20/20]
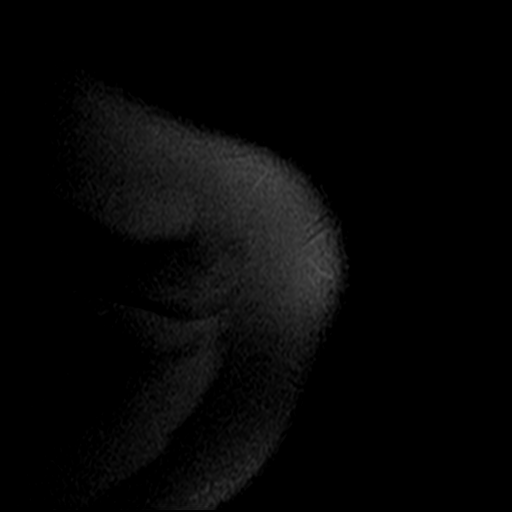

[Series 4: T2 fat-sat · oblique · 4.0mm · 0.55mm/px · 7 of 17 slices shown (1 of 2)]
[im 1/17]
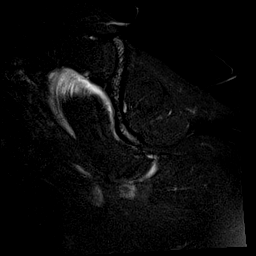
[im 3/17]
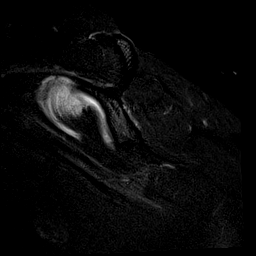
[im 6/17]
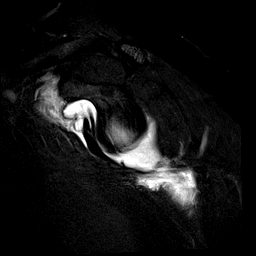
[im 9/17]
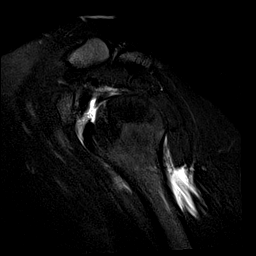
[im 11/17]
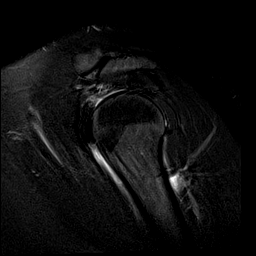
[im 14/17]
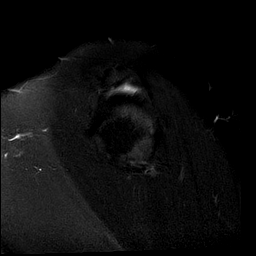
[im 17/17]
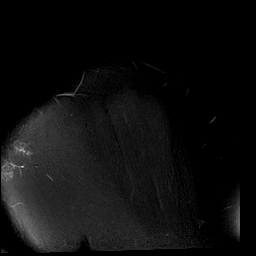

[Series 10: T1 · oblique · 4.0mm · 0.44mm/px · 6 of 16 slices shown]
[im 1/16]
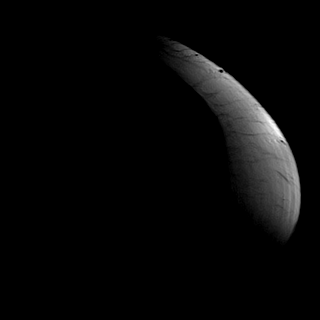
[im 4/16]
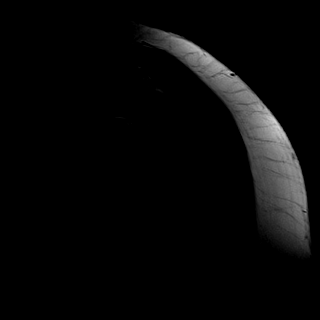
[im 7/16]
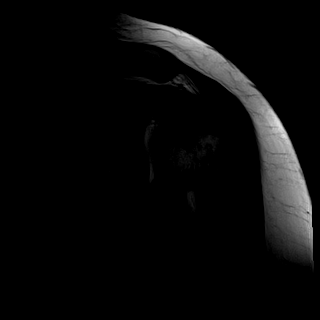
[im 10/16]
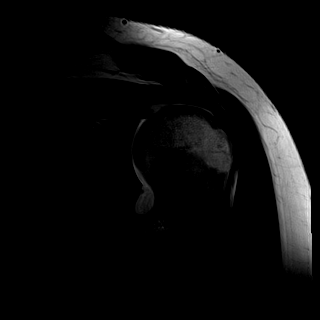
[im 13/16]
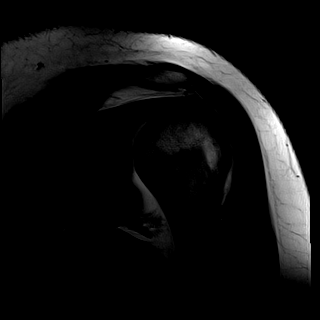
[im 16/16]
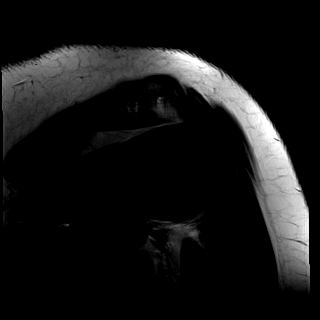

[Series 11: T1 fat-sat · oblique · 4.0mm · 0.55mm/px · 6 of 16 slices shown (2 of 3)]
[im 1/16]
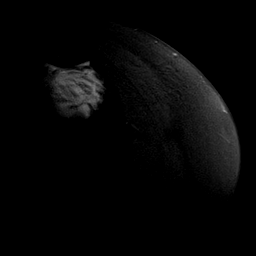
[im 4/16]
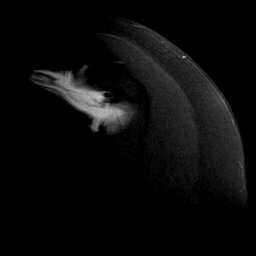
[im 7/16]
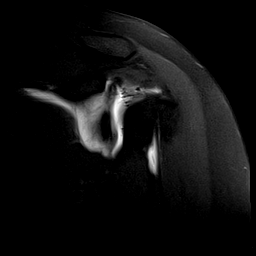
[im 10/16]
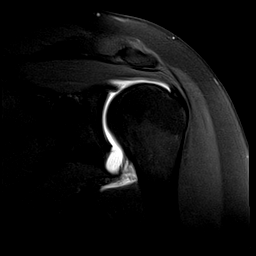
[im 13/16]
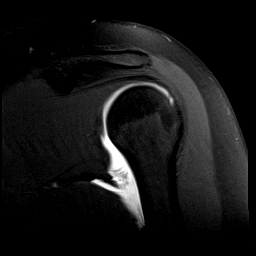
[im 16/16]
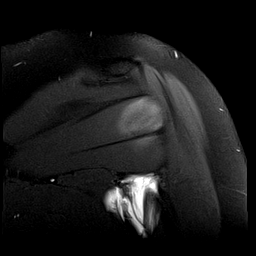

[Series 12: T2 fat-sat · oblique · 4.0mm · 0.55mm/px · 6 of 16 slices shown (2 of 2)]
[im 1/16]
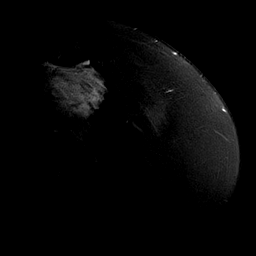
[im 4/16]
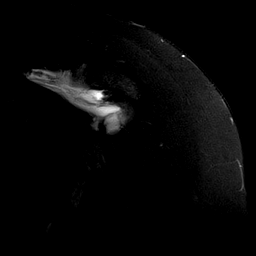
[im 7/16]
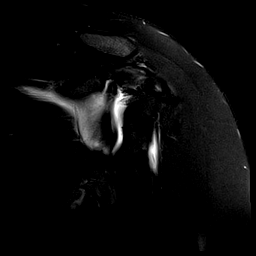
[im 10/16]
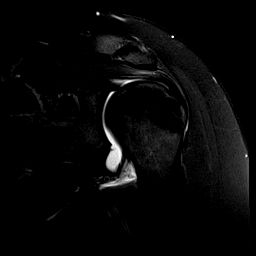
[im 13/16]
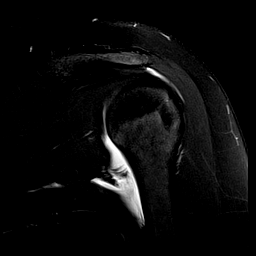
[im 16/16]
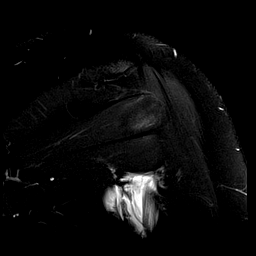

[Series 13: T1 fat-sat · sagittal · 4.0mm · 0.59mm/px · 4 of 16 slices shown (3 of 3)]
[im 1/16]
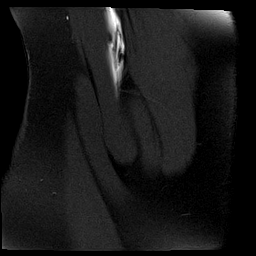
[im 4/16]
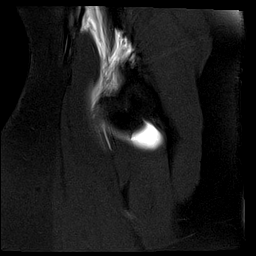
[im 7/16]
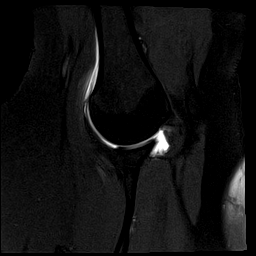
[im 10/16]
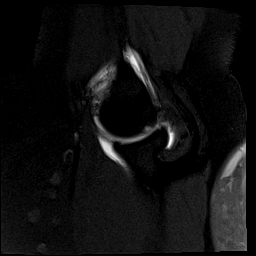

[38 of 40 positions shown; findings below may reference images not displayed]

FINDINGS: Rotator cuff: Moderate to prominent focal tendinopathy in the
supraspinatus tendon on image 9 series 12 with intrasubstance
tearing and some articular surface partial tearing. Given the lack
of contrast extension to the subacromial subdeltoid bursa this is
not thought represent full-thickness tearing. Distally near the
junction of the supraspinatus and infraspinatus tendons there is
partial thickness bursal surface tearing on image [DATE].

The subscapularis and teres minor tendons appear intact.

Muscles: Unremarkable

Biceps long head: Irregular and likely at least partially torn
proximal intra-articular segment.

Acromioclavicular Joint: Anteriorly downsloping acromion may
predispose to impingement. AC joint itself appears otherwise
unremarkable. There is mild subacromial subdeltoid bursitis.

Glenohumeral Joint: Ruptured posterior band of the inferior
glenohumeral ligament from the humerus, image [DATE], with contrast
leakage from the axillary pouch.

Articular cartilage unremarkable.

Labrum: Grossly unremarkable

Bones: No significant extra-articular osseous abnormalities
identified.
IMPRESSION: 1. The posterior band of the inferior glenohumeral ligament appears
to be torn away from the humerus, with resultant leakage of contrast
from the axillary pouch into the adjacent tissues.
2. Moderate to prominent focal tendinopathy in the supraspinatus
tendon in the critical zone. There is also some partial thickness
bursal surface tearing of the distal most supraspinatus tendon. No
full-thickness rotator cuff tear is identified.
3. Mild subacromial subdeltoid bursitis.
4. Irregular and somewhat diminutive proximal long head biceps
tendon, likely partially torn.

## 2016-11-04 ENCOUNTER — Encounter: Payer: Self-pay | Admitting: Primary Care

## 2016-11-23 ENCOUNTER — Encounter: Payer: Self-pay | Admitting: Primary Care

## 2016-11-23 ENCOUNTER — Ambulatory Visit (INDEPENDENT_AMBULATORY_CARE_PROVIDER_SITE_OTHER): Payer: Managed Care, Other (non HMO) | Admitting: Primary Care

## 2016-11-23 VITALS — BP 122/82 | HR 84 | Temp 98.2°F | Ht 67.0 in | Wt 236.8 lb

## 2016-11-23 DIAGNOSIS — R059 Cough, unspecified: Secondary | ICD-10-CM

## 2016-11-23 DIAGNOSIS — R05 Cough: Secondary | ICD-10-CM | POA: Diagnosis not present

## 2016-11-23 DIAGNOSIS — Z23 Encounter for immunization: Secondary | ICD-10-CM | POA: Diagnosis not present

## 2016-11-23 MED ORDER — ALBUTEROL SULFATE HFA 108 (90 BASE) MCG/ACT IN AERS: 2.0000 | INHALATION_SPRAY | Freq: Four times a day (QID) | RESPIRATORY_TRACT | 0 refills | Status: DC | PRN

## 2016-11-23 NOTE — Progress Notes (Signed)
Pre visit review using our clinic review tool, if applicable. No additional management support is needed unless otherwise documented below in the visit note. 

## 2016-11-23 NOTE — Patient Instructions (Signed)
Albuterol Inhaler: Inhale 2 puffs into the lungs every 6 to 8 hours as needed for wheezing, cough, and/or shortness of breath.   Start Zyrtec tablets every night at bedtime. Take this for the next 4 weeks, maybe longer.  Take a look at the triggers for acid reflux. Make sure you are not laying down within two hours after eating a meal.  Please notify me if no improvement by Monday next week.  It was a pleasure to see you today!   Food Choices for Gastroesophageal Reflux Disease, Adult When you have gastroesophageal reflux disease (GERD), the foods you eat and your eating habits are very important. Choosing the right foods can help ease your discomfort. What guidelines do I need to follow?  Choose fruits, vegetables, whole grains, and low-fat dairy products.  Choose low-fat meat, fish, and poultry.  Limit fats such as oils, salad dressings, butter, nuts, and avocado.  Keep a food diary. This helps you identify foods that cause symptoms.  Avoid foods that cause symptoms. These may be different for everyone.  Eat small meals often instead of 3 large meals a day.  Eat your meals slowly, in a place where you are relaxed.  Limit fried foods.  Cook foods using methods other than frying.  Avoid drinking alcohol.  Avoid drinking large amounts of liquids with your meals.  Avoid bending over or lying down until 2-3 hours after eating. What foods are not recommended? These are some foods and drinks that may make your symptoms worse: Vegetables  Tomatoes. Tomato juice. Tomato and spaghetti sauce. Chili peppers. Onion and garlic. Horseradish. Fruits  Oranges, grapefruit, and lemon (fruit and juice). Meats  High-fat meats, fish, and poultry. This includes hot dogs, ribs, ham, sausage, salami, and bacon. Dairy  Whole milk and chocolate milk. Sour cream. Cream. Butter. Ice cream. Cream cheese. Drinks  Coffee and tea. Bubbly (carbonated) drinks or energy drinks. Condiments  Hot  sauce. Barbecue sauce. Sweets/Desserts  Chocolate and cocoa. Donuts. Peppermint and spearmint. Fats and Oils  High-fat foods. This includes [place] fries and potato chips. Other  Vinegar. Strong spices. This includes black pepper, white pepper, red pepper, cayenne, curry powder, cloves, ginger, and chili powder. The items listed above may not be a complete list of foods and drinks to avoid. Contact your dietitian for more information.  This information is not intended to replace advice given to you by your health care provider. Make sure you discuss any questions you have with your health care provider. Document Released: 06/06/2012 Document Revised: 05/13/2016 Document Reviewed: 10/10/2013 Elsevier Interactive Patient Education  2017 [employer]

## 2016-11-23 NOTE — Progress Notes (Signed)
   Subjective:    Patient ID: Natasha Yang, female    DOB: 09/17/1986, 30 y.o.   MRN: [id number]  HPI  Natasha Yang is a 30 year old female with a history of asthma who presents today with a chief complaint of cough. Her asthma was diagnosed in childhood and has not been problematic during her adulthood as of yet. She has no albuterol inhaler at home.  Her cough has been present for the past 1-2 months. Her cough is worse in the afternoon and evening. She will experience coughing spells that will not improve with water and she will feel like she cannot catch her breath during these coughing spells. She has noticed some wheezing and epigastric tightness during the coughing spells. She denies esophageal burning, belching, abdominal pain, sneezing, itchy/watery eyes, fevers, fatigue. She's been taking Mucinex, sudafed, cough drops without any improvement.    Review of Systems  Constitutional: Negative for chills, fatigue and fever.  HENT: Negative for congestion, ear pain and sore throat.   Respiratory: Positive for cough, chest tightness and wheezing. Negative for shortness of breath.        Past Medical History:  Diagnosis Date  . Asthma   . Chickenpox   . Difficulty concentrating   . Hyperhydrosis disorder      Social History   Social History  . Marital status: Single    Spouse name: N/A  . Number of children: N/A  . Years of education: N/A   Occupational History  . Not on file.   Social History Main Topics  . Smoking status: Never Smoker  . Smokeless tobacco: Never Used  . Alcohol use 0.0 oz/week     Comment: soical  . Drug use: No  . Sexual activity: Not on file   Other Topics Concern  . Not on file   Social History Narrative   Married.   1 child.   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   Currently Landscape architectgetting Masters for Social Work.    Enjoys spending time with family.    Past Surgical History:  Procedure Laterality Date  . CESAREAN SECTION       Family History  Problem Relation Age of Onset  . Hypertension Mother   . Hypertension Father     No Known Allergies  No current outpatient prescriptions on file prior to visit.   No current facility-administered medications on file prior to visit.     BP 122/82   Pulse 84   Temp 98.2 F (36.8 C) (Oral)   Ht 5\' 7"  (1.702 m)   Wt 236 lb 12.8 oz (107.4 kg)   SpO2 97%   BMI 37.09 kg/m    Objective:   Physical Exam  Constitutional: She appears well-nourished.  HENT:  Right Ear: Tympanic membrane and ear canal normal.  Left Ear: Tympanic membrane and ear canal normal.  Nose: Right sinus exhibits no maxillary sinus tenderness and no frontal sinus tenderness. Left sinus exhibits no maxillary sinus tenderness and no frontal sinus tenderness.  Mouth/Throat: Oropharynx is clear and moist.  Eyes: Conjunctivae are normal.  Neck: Neck supple.  Cardiovascular: Normal rate and regular rhythm.   Pulmonary/Chest: Effort normal and breath sounds normal. She has no wheezes. She has no rales.  Dry cough noted during exam  Lymphadenopathy:    She has no cervical adenopathy.  Skin: Skin is warm and dry.          Assessment & Plan:

## 2016-11-23 NOTE — Addendum Note (Signed)
Addended by: Tawnya CrookSAMBATH, Rozelle Caudle on: 11/23/2016 01:04 PM   Modules accepted: Orders

## 2016-11-23 NOTE — Assessment & Plan Note (Signed)
Present for the past 1-2 months. History of childhood asthma, no problems during adulthood. Dry cough noted today. No evidence of viral/bacterial involvement. Differentials include asthma, GERD, allergy involvement.  Will start with albuterol inhaler PRN. Discussed to notify if no improvement by Monday next week. Also discussed to notify if she's requiring use of albuterol more than 3 times weekly. Start Zyrtec HS. If no improvement, will consider H2 blocker for GERD. Will consider spirometry next visit.

## 2017-09-14 ENCOUNTER — Telehealth: Payer: Self-pay | Admitting: Primary Care

## 2017-09-14 NOTE — Telephone Encounter (Signed)
Jae Dire is a great provider. I see no reason for her to need to switch.

## 2017-09-14 NOTE — Telephone Encounter (Signed)
Patient would like to switch from [name] to Natasha Yang.  Patient wants to switch due to preference. Patient is having lower abdomonel pain and would like to be seen next week. Patient said a detailed message can be left on her voice mail with response.

## 2017-09-27 ENCOUNTER — Encounter: Payer: Self-pay | Admitting: Physician Assistant

## 2017-09-27 ENCOUNTER — Ambulatory Visit (INDEPENDENT_AMBULATORY_CARE_PROVIDER_SITE_OTHER): Payer: Managed Care, Other (non HMO) | Admitting: Physician Assistant

## 2017-09-27 VITALS — BP 128/64 | HR 68 | Temp 98.6°F | Resp 16 | Ht 66.0 in | Wt 235.0 lb

## 2017-09-27 DIAGNOSIS — Z23 Encounter for immunization: Secondary | ICD-10-CM | POA: Diagnosis not present

## 2017-09-27 DIAGNOSIS — Z0000 Encounter for general adult medical examination without abnormal findings: Secondary | ICD-10-CM | POA: Diagnosis not present

## 2017-09-27 DIAGNOSIS — F411 Generalized anxiety disorder: Secondary | ICD-10-CM

## 2017-09-27 DIAGNOSIS — Z131 Encounter for screening for diabetes mellitus: Secondary | ICD-10-CM | POA: Diagnosis not present

## 2017-09-27 DIAGNOSIS — Z1329 Encounter for screening for other suspected endocrine disorder: Secondary | ICD-10-CM | POA: Diagnosis not present

## 2017-09-27 DIAGNOSIS — R1900 Intra-abdominal and pelvic swelling, mass and lump, unspecified site: Secondary | ICD-10-CM | POA: Diagnosis not present

## 2017-09-27 DIAGNOSIS — Z13220 Encounter for screening for lipoid disorders: Secondary | ICD-10-CM

## 2017-09-27 LAB — LIPID PANEL
Cholesterol: 155 mg/dL (ref <200)
HDL: 49 mg/dL — ABNORMAL LOW (ref >50)
LDL Cholesterol (Calc): 86 mg/dL (calc)
Non-HDL Cholesterol (Calc): 106 mg/dL (calc) (ref <130)
Total CHOL/HDL Ratio: 3.2 (calc) (ref <5.0)
Triglycerides: 100 mg/dL (ref <150)

## 2017-09-27 LAB — CBC WITH DIFFERENTIAL/PLATELET
Basophils Absolute: 50 cells/uL (ref 0–200)
Basophils Relative: 0.7 %
Eosinophils Absolute: 108 cells/uL (ref 15–500)
Eosinophils Relative: 1.5 %
HCT: 39.3 % (ref 35.0–45.0)
Hemoglobin: 13.3 g/dL (ref 11.7–15.5)
Lymphs Abs: 1944 cells/uL (ref 850–3900)
MCH: 28.7 pg (ref 27.0–33.0)
MCHC: 33.8 g/dL (ref 32.0–36.0)
MCV: 84.9 fL (ref 80.0–100.0)
MPV: 13 fL — ABNORMAL HIGH (ref 7.5–12.5)
Monocytes Relative: 6 %
Neutro Abs: 4666 cells/uL (ref 1500–7800)
Neutrophils Relative %: 64.8 %
Platelets: 168 10*3/uL (ref 140–400)
RBC: 4.63 10*6/uL (ref 3.80–5.10)
RDW: 12.3 % (ref 11.0–15.0)
Total Lymphocyte: 27 %
WBC mixed population: 432 cells/uL (ref 200–950)
WBC: 7.2 10*3/uL (ref 3.8–10.8)

## 2017-09-27 LAB — COMPLETE METABOLIC PANEL WITH GFR
AG Ratio: 1.9 (calc) (ref 1.0–2.5)
ALT: 11 U/L (ref 6–29)
AST: 13 U/L (ref 10–30)
Albumin: 4.3 g/dL (ref 3.6–5.1)
Alkaline phosphatase (APISO): 62 U/L (ref 33–115)
BUN: 10 mg/dL (ref 7–25)
CO2: 33 mmol/L — ABNORMAL HIGH (ref 20–32)
Calcium: 9.1 mg/dL (ref 8.6–10.2)
Chloride: 101 mmol/L (ref 98–110)
Creat: 0.74 mg/dL (ref 0.50–1.10)
GFR, Est African American: 127 mL/min/{1.73_m2} (ref >=60)
GFR, Est Non African American: 109 mL/min/{1.73_m2} (ref >=60)
Globulin: 2.3 g/dL (calc) (ref 1.9–3.7)
Glucose, Bld: 102 mg/dL — ABNORMAL HIGH (ref 65–99)
Potassium: 3.8 mmol/L (ref 3.5–5.3)
Sodium: 140 mmol/L (ref 135–146)
Total Bilirubin: 0.7 mg/dL (ref 0.2–1.2)
Total Protein: 6.6 g/dL (ref 6.1–8.1)

## 2017-09-27 LAB — TSH: TSH: 0.91 mIU/L

## 2017-09-27 NOTE — Progress Notes (Signed)
Patient: Natasha Yang Female    DOB: 01-07-1986   31 y.o.   MRN: [id number] Visit Date: 09/27/2017  Today's Provider: Trey Sailors, PA-C   Chief Complaint  Patient presents with  . Establish Care  . Abdominal Pain   Subjective:    Natasha Yang is a 31 y/o woman presenting today to establish care. She was previously seen by [facility].  She lives in [place] with her husband of 6 years and 77 year old daughter. Says her relationship is going well. She works for [employer] in [place]. She is sexually active, no concerns for STI. She has an IUD, does not have periods.  She sees Dr. Leeroy Bock Ward at [facility]. She has not had recent pap smear or HPV but reports she has upcoming appointment where this will be addressed. No history of abnormal PAP smears, did have HPV vaccination. History of breast cancer in paternal aunts age 53's and some maternal 2nd cousins. No history of colon cancer in family.  She was placed on HCTZ for intermittent swelling of her hands and legs. She notices swelling in her legs after long days of standing. Also had extreme swelling of hands, happens in hot weather.  She has Rx for Effexor, which she reports is for anxiety. She says she takes this as needed. She took it daily for seven days recently and felt poorly, nauseated and dizzy. She has an Rx for klonopin as well for events like flying and public speaking.   Was previously prescribed Phentermine , she has never taken this.   She also has history of right sided abdominal pain x 2 months. Intermittent pain, pulling sensation. No unintended weight loss, back pain, night sweats. Was evaluated by Dr. Elesa Massed in gynecology, thought to be possible scar tissue or fat hernia.   Pt comes today complaining of a lower right abdominal pain. She first noticed the pain about two months then she noticed a "lump" in the same area.  She stated the pain comes and goes but has become more frequent.     Abdominal Pain  This is a new problem. The current episode started more than 1 month ago. The onset quality is sudden. The problem occurs intermittently. The problem has been gradually worsening. Quality: "Pulling, throbbing, pressure" The abdominal pain does not radiate. Pertinent negatives include no constipation, diarrhea, nausea or vomiting.        No Known Allergies   Current Outpatient Prescriptions:  .  clonazePAM (KLONOPIN) 1 MG tablet, , Disp: , Rfl:  .  hydrochlorothiazide (HYDRODIURIL) 50 MG tablet, , Disp: , Rfl:  .  levonorgestrel (MIRENA) 20 MCG/24HR IUD, by Intrauterine route., Disp: , Rfl:  .  venlafaxine XR (EFFEXOR-XR) 37.5 MG 24 hr capsule, Take 37.5 mg by mouth daily with breakfast., Disp: , Rfl:   Review of Systems  Constitutional: Negative.   HENT: Negative.   Eyes: Negative.   Respiratory: Negative.   Cardiovascular: Negative.   Gastrointestinal: Positive for abdominal pain. Negative for abdominal distention, anal bleeding, blood in stool, constipation, diarrhea, nausea, rectal pain and vomiting.  Endocrine: Negative.   Genitourinary: Negative.   Musculoskeletal: Negative.   Skin: Negative.   Allergic/Immunologic: Negative.   Neurological: Negative.   Hematological: Negative.   Psychiatric/Behavioral: Negative.     Social History  Substance Use Topics  . Smoking status: Never Smoker  . Smokeless tobacco: Never Used  . Alcohol use 0.0 oz/week     Comment: soical  Objective:   BP 128/64 (BP Location: Left Arm, Patient Position: Sitting, Cuff Size: Large)   Pulse 68   Temp 98.6 F (37 C) (Oral)   Resp 16   Ht  (1.676 m)   Wt 235 lb (106.6 kg)   BMI 37.93 kg/m  Vitals:   09/27/17 0917  BP: 128/64  Pulse: 68  Resp: 16  Temp: 98.6 F (37 C)  TempSrc: Oral  Weight: 235 lb (106.6 kg)  Height:  (1.676 m)     Physical Exam  Constitutional: She is oriented to person, place, and time. She appears well-developed and  well-nourished.  Cardiovascular: Normal rate and regular rhythm.   Pulmonary/Chest: Effort normal and breath sounds normal.  Abdominal: Soft. Bowel sounds are normal. She exhibits no distension. There is no tenderness. There is no rebound.  Small 2 cm well circumscribed lesion in right pelvic area felt upon standing.  Neurological: She is alert and oriented to person, place, and time.  Skin: Skin is warm and dry.  Psychiatric: She has a normal mood and affect. Her behavior is normal.        Assessment & Plan:     1. Annual physical exam  - CBC with Differential  2. Screening cholesterol level  - Lipid panel  3. Thyroid disorder screening  - TSH  4. Diabetes mellitus screening  - Comprehensive metabolic panel  5. Generalized anxiety disorder  Needs to take effexor daily for it to be effective. Side effects should lessen with continue use.  6. Pelvic mass in female  Do agree likely scar tissue, may be possible hernia. Can be further evaluated by surgery. Low suspicion for malignancy.   - Ambulatory referral to General Surgery  7. Flu vaccine need  - Flu Vaccine QUAD 6+ mos PF IM (Fluarix Quad PF)  Return in about 6 months (around 03/28/2018) for anxiety follow up .  The entirety of the information documented in the History of Present Illness, Review of Systems and Physical Exam were personally obtained by me. Portions of this information were initially documented by Kavin Leech, CMA and reviewed by me for thoroughness and accuracy.        Trey Sailors, PA-C  [facility]

## 2017-09-27 NOTE — Patient Instructions (Addendum)

## 2017-09-29 ENCOUNTER — Ambulatory Visit (INDEPENDENT_AMBULATORY_CARE_PROVIDER_SITE_OTHER): Payer: Managed Care, Other (non HMO) | Admitting: General Surgery

## 2017-09-29 ENCOUNTER — Encounter: Payer: Self-pay | Admitting: General Surgery

## 2017-09-29 ENCOUNTER — Inpatient Hospital Stay: Payer: Self-pay

## 2017-09-29 VITALS — BP 132/88 | HR 94 | Resp 12 | Ht 66.0 in | Wt 237.4 lb

## 2017-09-29 DIAGNOSIS — N80129 Deep endometriosis of ovary, unspecified ovary: Secondary | ICD-10-CM

## 2017-09-29 DIAGNOSIS — R1903 Right lower quadrant abdominal swelling, mass and lump: Secondary | ICD-10-CM

## 2017-09-29 DIAGNOSIS — N809 Endometriosis, unspecified: Secondary | ICD-10-CM | POA: Insufficient documentation

## 2017-09-29 NOTE — Patient Instructions (Addendum)
The patient is aware to call back for any questions or concerns.  excision of endometriomas under Same Day Surgery

## 2017-09-29 NOTE — Progress Notes (Signed)
Patient ID: AXEL MEAS, female   DOB: [birth year], 31 y.o.   MRN: [id number]  Chief Complaint  Patient presents with  . Abdominal Pain    HPI Natasha Yang is a 31 y.o. female here today for a evaluation of a possible pelvic mass referred by Marykay Lex PA. She states her GYN [name]) completed a vaginal ultrasound which was negative. She has been having abdomidal pain for about few months now. The onset is sudden woke her up at night but seems to have gotten worse. It is described as a throbbing/pulling pressure. The pain does not radiate. The pain comes and goes. She can feel a knot right lower suprapubic near her C-section scar for about a month now. She does go to the gym but does not remember an incident of trauma. There is no cyclic tenderness. The patient makes use of an IUD for contraception.  HPI  Past Medical History:  Diagnosis Date  . Asthma   . Chickenpox   . Difficulty concentrating   . Hyperhydrosis disorder     Past Surgical History:  Procedure Laterality Date  . CESAREAN SECTION  2015    Family History  Problem Relation Age of Onset  . Hypertension Mother   . Hypertension Father   . Melanoma Father   . Healthy Sister   . Healthy Brother   . Breast cancer Paternal Aunt   . Colon cancer Neg Hx     Social History Social History  Substance Use Topics  . Smoking status: Never Smoker  . Smokeless tobacco: Never Used  . Alcohol use 0.0 oz/week     Comment: social    No Known Allergies  Current Outpatient Prescriptions  Medication Sig Dispense Refill  . clonazePAM (KLONOPIN) 1 MG tablet Take 1 mg by mouth as needed.     . hydrochlorothiazide (HYDRODIURIL) 50 MG tablet Take 50 mg by mouth daily.     Marland Kitchen levonorgestrel (MIRENA) 20 MCG/24HR IUD by Intrauterine route.    . venlafaxine XR (EFFEXOR-XR) 37.5 MG 24 hr capsule Take 37.5 mg by mouth daily with breakfast.     No current facility-administered medications for this visit.      Review of Systems Review of Systems  Constitutional: Negative.   Respiratory: Negative.   Cardiovascular: Negative.   Gastrointestinal: Positive for abdominal pain. Negative for diarrhea, nausea and vomiting.    Blood pressure 132/88, pulse 94, resp. rate 12, height  (1.676 m), weight 237 lb 6.4 oz (107.7 kg), SpO2 98 %.  Physical Exam Physical Exam  Constitutional: She is oriented to person, place, and time. She appears well-developed and well-nourished.  HENT:  Mouth/Throat: Oropharynx is clear and moist.  Eyes: Conjunctivae are normal. No scleral icterus.  Neck: Neck supple.  Cardiovascular: Normal rate, regular rhythm and normal heart sounds.   Pulmonary/Chest: Effort normal and breath sounds normal.  Abdominal: Soft. Normal appearance and bowel sounds are normal.    Lymphadenopathy:    She has no cervical adenopathy.  Neurological: She is alert and oriented to person, place, and time.  Skin: Skin is warm and dry.  Psychiatric: Her behavior is normal.    Data Reviewed Transvaginal ultrasound dated 07/25/2017 showed the IUD in place. No free fluid. Ovaries normal.  Ultrasound examination of the abdominal wall showed a hypoechoic mass above the rectus fascia measuring at a minimum 1.2 x 1.36 x 1.8 cm. This seemed to have a surrounding rim of edema. Medial to this a smaller, nonpalpable mass  measuring 0.3 x 1.1 x 1.14 cm is noted. This has similar characteristics with modest posterior acoustic enhancement. The dominant, palpable lesion show significant vascular flow. Findings are consistent with an endometrioma. No evidence of fascial defect to suggest hernia.  Laboratory studies dated 09/27/2017: CBC normal with a hemoglobin of 13.3 with an MCV of 85, white blood cell count 7200, platelet count of 168,000. Comprehensive metabolic panel the same date showed normal renal function and liver function studies. Estimated GFR 109.  Assessment    Abdominal wall  endometrioma, symptomatic.     Plan    Options for management include hormone suppression versus excision.    Discussed excision of endometriomas as an outpatient at Same Day Surgery.     HPI, Physical Exam, Assessment and Plan have been scribed under the direction and in the presence of Earline Mayotte, MD. Dorathy Daft, RN   Earline Mayotte 09/29/2017, 8:42 PM  Patient's surgery has been scheduled for 10-24-17 at [facility].   Nicholes Mango, CMA

## 2017-09-30 ENCOUNTER — Other Ambulatory Visit: Payer: Self-pay | Admitting: General Surgery

## 2017-10-10 ENCOUNTER — Encounter: Payer: Self-pay | Admitting: Physician Assistant

## 2017-10-17 ENCOUNTER — Encounter
Admission: RE | Admit: 2017-10-17 | Discharge: 2017-10-17 | Disposition: A | Discharge status: Home / Self Care | Payer: Managed Care, Other (non HMO) | Source: Ambulatory Visit | Attending: General Surgery | Admitting: General Surgery

## 2017-10-17 HISTORY — DX: Anxiety disorder, unspecified: F41.9

## 2017-10-17 HISTORY — DX: Thrombocytopenia, unspecified: D69.6

## 2017-10-17 NOTE — Patient Instructions (Addendum)
  Your procedure is scheduled on: 10-24-17 Report to Same Day Surgery 2nd floor medical mall [facility](Medical Mall Entrance-take elevator on left to 2nd floor.  Check in with surgery information desk.) To find out your arrival time please call [phone] between 1PM - 3PM on 10-21-17  Remember: Instructions that are not followed completely may result in serious medical risk, up to and including death, or upon the discretion of your surgeon and anesthesiologist your surgery may need to be rescheduled.    _x___ 1. Do not eat food after midnight the night before your procedure. NO GUM CHEWING OR HARD CANDIES.  You may drink clear liquids up to 2 hours before you are scheduled to arrive at the hospital for your procedure.  Do not drink clear liquids within 2 hours of your scheduled arrival to the hospital.  Clear liquids include  --Water or Apple juice without pulp  --Clear carbohydrate beverage such as ClearFast or Gatorade  --Black Coffee or Clear Tea (No milk, no creamers, do not add anything to the coffee or Tea)      __x__ 2. No Alcohol for 24 hours before or after surgery.   __x__3. No Smoking for 24 prior to surgery.   ____  4. Bring all medications with you on the day of surgery if instructed.    __x__ 5. Notify your doctor if there is any change in your medical condition     (cold, fever, infections).     Do not wear jewelry, make-up, hairpins, clips or nail polish.  Do not wear lotions, powders, or perfumes. You may wear deodorant.  Do not shave 48 hours prior to surgery. Men may shave face and neck.  Do not bring valuables to the hospital.    [facility] is not responsible for any belongings or valuables.               Contacts, dentures or bridgework may not be worn into surgery.  Leave your suitcase in the car. After surgery it may be brought to your room.  For patients admitted to the hospital, discharge time is determined by your  treatment team.   Patients discharged the day  of surgery will not be allowed to drive home.  You will need someone to drive you home and stay with you the night of your procedure.    Please read over the following fact sheets that you were given:      _x___ TAKE THE FOLLOWING MEDICATIONS THE MORNING OF SURGERY WITH A SMALL SIP OF WATER. These include:  1. EFFEXOR  2.  3.  4.  5.  6.  ____Fleets enema or Magnesium Citrate as directed.   _x___ Use CHG Soap or sage wipes as directed on instruction sheet   ____ Use inhalers on the day of surgery and bring to hospital day of surgery  ____ Stop Metformin and Janumet 2 days prior to surgery.    ____ Take 1/2 of usual insulin dose the night before surgery and none on the morning surgery.   ____ Follow recommendations from Cardiologist, Pulmonologist or PCP regarding stopping Aspirin, Coumadin, Plavix ,Eliquis, Effient, or Pradaxa, and Pletal.  ____Stop Anti-inflammatories such as Advil, Aleve, Ibuprofen, Motrin, Naproxen, Naprosyn, Goodies powders or aspirin products. OK to take Tylenol .   ____ Stop supplements until after surgery.     ____ Bring C-Pap to the hospital.

## 2017-10-24 ENCOUNTER — Other Ambulatory Visit: Payer: Self-pay

## 2017-10-24 ENCOUNTER — Encounter
Admission: RE | Disposition: A | Discharge status: Home / Self Care | Payer: Self-pay | Source: Ambulatory Visit | Attending: General Surgery

## 2017-10-24 ENCOUNTER — Encounter: Payer: Self-pay | Admitting: *Deleted

## 2017-10-24 ENCOUNTER — Ambulatory Visit: Payer: Managed Care, Other (non HMO) | Admitting: Anesthesiology

## 2017-10-24 ENCOUNTER — Ambulatory Visit
Admission: RE | Admit: 2017-10-24 | Discharge: 2017-10-24 | Disposition: A | Discharge status: Home / Self Care | Payer: Managed Care, Other (non HMO) | Source: Ambulatory Visit | Attending: General Surgery | Admitting: General Surgery

## 2017-10-24 DIAGNOSIS — I739 Peripheral vascular disease, unspecified: Secondary | ICD-10-CM | POA: Insufficient documentation

## 2017-10-24 DIAGNOSIS — N808 Other endometriosis: Secondary | ICD-10-CM | POA: Diagnosis present

## 2017-10-24 DIAGNOSIS — N809 Endometriosis, unspecified: Secondary | ICD-10-CM | POA: Diagnosis not present

## 2017-10-24 DIAGNOSIS — Z79899 Other long term (current) drug therapy: Secondary | ICD-10-CM | POA: Insufficient documentation

## 2017-10-24 DIAGNOSIS — N80129 Deep endometriosis of ovary, unspecified ovary: Secondary | ICD-10-CM

## 2017-10-24 HISTORY — PX: EXCISION OF ABDOMINAL WALL TUMOR: SHX6687

## 2017-10-24 LAB — POCT PREGNANCY, URINE: PREG TEST UR: NEGATIVE

## 2017-10-24 SURGERY — EXCISION, NEOPLASM, ABDOMINAL WALL
Anesthesia: General | Laterality: Right | Wound class: Clean

## 2017-10-24 MED ORDER — BUPIVACAINE HCL (PF) 0.5 % IJ SOLN
INTRAMUSCULAR | Status: AC
  Filled 2017-10-24: qty 30

## 2017-10-24 MED ORDER — LACTATED RINGERS IV SOLN
INTRAVENOUS | Status: DC
  Administered 2017-10-24 (×2): via INTRAVENOUS

## 2017-10-24 MED ORDER — HYDROCODONE-ACETAMINOPHEN 5-325 MG PO TABS
1.0000 | ORAL_TABLET | ORAL | Status: DC | PRN
Start: 2017-10-24 — End: 2017-10-24
  Administered 2017-10-24: 1 via ORAL

## 2017-10-24 MED ORDER — HYDROCODONE-ACETAMINOPHEN 5-325 MG PO TABS
ORAL_TABLET | ORAL | Status: AC
  Filled 2017-10-24: qty 1

## 2017-10-24 MED ORDER — FENTANYL CITRATE (PF) 100 MCG/2ML IJ SOLN
INTRAMUSCULAR | Status: AC
  Filled 2017-10-24: qty 2

## 2017-10-24 MED ORDER — PROPOFOL 10 MG/ML IV BOLUS
INTRAVENOUS | Status: DC | PRN
  Administered 2017-10-24: 140 mg via INTRAVENOUS

## 2017-10-24 MED ORDER — MIDAZOLAM HCL 2 MG/2ML IJ SOLN
INTRAMUSCULAR | Status: DC | PRN
  Administered 2017-10-24: 2 mg via INTRAVENOUS

## 2017-10-24 MED ORDER — FAMOTIDINE 20 MG PO TABS
ORAL_TABLET | ORAL | Status: AC
  Administered 2017-10-24: 20 mg via ORAL
  Filled 2017-10-24: qty 1

## 2017-10-24 MED ORDER — FENTANYL CITRATE (PF) 100 MCG/2ML IJ SOLN
25.0000 ug | INTRAMUSCULAR | Status: DC | PRN
  Administered 2017-10-24 (×5): 25 ug via INTRAVENOUS

## 2017-10-24 MED ORDER — KETOROLAC TROMETHAMINE 30 MG/ML IJ SOLN
INTRAMUSCULAR | Status: DC | PRN
  Administered 2017-10-24: 30 mg via INTRAVENOUS

## 2017-10-24 MED ORDER — HYDROCODONE-ACETAMINOPHEN 5-325 MG PO TABS: 1.0000 | ORAL_TABLET | ORAL | 0 refills | Status: DC | PRN

## 2017-10-24 MED ORDER — ONDANSETRON HCL 4 MG/2ML IJ SOLN
INTRAMUSCULAR | Status: DC | PRN
  Administered 2017-10-24: 4 mg via INTRAVENOUS

## 2017-10-24 MED ORDER — BUPIVACAINE-EPINEPHRINE (PF) 0.5% -1:200000 IJ SOLN
INTRAMUSCULAR | Status: AC
  Filled 2017-10-24: qty 30

## 2017-10-24 MED ORDER — BUPIVACAINE-EPINEPHRINE 0.5% -1:200000 IJ SOLN
INTRAMUSCULAR | Status: DC | PRN
  Administered 2017-10-24: 30 mL

## 2017-10-24 MED ORDER — MIDAZOLAM HCL 2 MG/2ML IJ SOLN
INTRAMUSCULAR | Status: AC
  Filled 2017-10-24: qty 2

## 2017-10-24 MED ORDER — ONDANSETRON HCL 4 MG/2ML IJ SOLN: 4.0000 mg | Freq: Once | INTRAMUSCULAR | Status: DC | PRN

## 2017-10-24 MED ORDER — FENTANYL CITRATE (PF) 100 MCG/2ML IJ SOLN
INTRAMUSCULAR | Status: DC | PRN
  Administered 2017-10-24 (×2): 50 ug via INTRAVENOUS

## 2017-10-24 MED ORDER — ACETAMINOPHEN 10 MG/ML IV SOLN
INTRAVENOUS | Status: DC | PRN
  Administered 2017-10-24: 1000 mg via INTRAVENOUS

## 2017-10-24 MED ORDER — FAMOTIDINE 20 MG PO TABS
20.0000 mg | ORAL_TABLET | Freq: Once | ORAL | Status: AC
  Administered 2017-10-24: 20 mg via ORAL

## 2017-10-24 MED ORDER — LIDOCAINE HCL (CARDIAC) 20 MG/ML IV SOLN
INTRAVENOUS | Status: DC | PRN
  Administered 2017-10-24: 50 mg via INTRAVENOUS

## 2017-10-24 MED ORDER — FENTANYL CITRATE (PF) 100 MCG/2ML IJ SOLN
INTRAMUSCULAR | Status: AC
  Administered 2017-10-24: 25 ug via INTRAVENOUS
  Filled 2017-10-24: qty 2

## 2017-10-24 SURGICAL SUPPLY — 23 items
CANISTER SUCT 1200ML W/VALVE (MISCELLANEOUS) ×3 IMPLANT
CHLORAPREP W/TINT 26ML (MISCELLANEOUS) ×3 IMPLANT
CLOSURE WOUND 1/2 X4 (GAUZE/BANDAGES/DRESSINGS) ×1
DRAPE LAPAROTOMY TRNSV 106X77 (MISCELLANEOUS) ×3 IMPLANT
DRSG TEGADERM 4X4.75 (GAUZE/BANDAGES/DRESSINGS) ×3 IMPLANT
DRSG TELFA 3X8 NADH (GAUZE/BANDAGES/DRESSINGS) ×3 IMPLANT
ELECT REM PT RETURN 9FT ADLT (ELECTROSURGICAL) ×3
ELECTRODE REM PT RTRN 9FT ADLT (ELECTROSURGICAL) ×1 IMPLANT
GLOVE BIO SURGEON STRL SZ7.5 (GLOVE) ×3 IMPLANT
GLOVE INDICATOR 8.0 STRL GRN (GLOVE) ×3 IMPLANT
GOWN STRL REUS W/ TWL LRG LVL3 (GOWN DISPOSABLE) ×2 IMPLANT
GOWN STRL REUS W/TWL LRG LVL3 (GOWN DISPOSABLE) ×4
KIT RM TURNOVER STRD PROC AR (KITS) ×3 IMPLANT
LABEL OR SOLS (LABEL) ×3 IMPLANT
NDL SAFETY 25GX1.5 (NEEDLE) ×3 IMPLANT
NS IRRIG 500ML POUR BTL (IV SOLUTION) ×3 IMPLANT
PACK BASIN MINOR ARMC (MISCELLANEOUS) ×3 IMPLANT
STRIP CLOSURE SKIN 1/2X4 (GAUZE/BANDAGES/DRESSINGS) ×2 IMPLANT
SUT VIC AB 3-0 SH 27 (SUTURE) ×2
SUT VIC AB 3-0 SH 27X BRD (SUTURE) ×1 IMPLANT
SUT VIC AB 4-0 FS2 27 (SUTURE) ×3 IMPLANT
SWABSTK COMLB BENZOIN TINCTURE (MISCELLANEOUS) ×3 IMPLANT
SYR CONTROL 10ML (SYRINGE) ×3 IMPLANT

## 2017-10-24 NOTE — Anesthesia Procedure Notes (Signed)
Procedure Name: LMA Insertion Date/Time: 10/24/2017 10:04 AM Performed by: Eduardo OsierKilduff, Amanada Philbrick, CRNA Pre-anesthesia Checklist: Patient identified, Emergency Drugs available, Suction available, Patient being monitored and Timeout performed Patient Re-evaluated:Patient Re-evaluated prior to induction Oxygen Delivery Method: Circle system utilized Preoxygenation: Pre-oxygenation with 100% oxygen Induction Type: IV induction LMA Size: 3.5 Number of attempts: 1 Placement Confirmation: breath sounds checked- equal and bilateral,  CO2 detector,  positive ETCO2 and ETT inserted through vocal cords under direct vision Tube secured with: Tape

## 2017-10-24 NOTE — Anesthesia Preprocedure Evaluation (Signed)
Anesthesia Evaluation  Patient identified by MRN, date of birth, ID band Patient awake    Reviewed: Allergy & Precautions, H&P , NPO status , Patient's Chart, lab work & pertinent test results, reviewed documented beta blocker date and time   Airway Mallampati: II  TM Distance: >3 FB Neck ROM: full    Dental  (+) Teeth Intact   Pulmonary neg pulmonary ROS, asthma ,    Pulmonary exam normal        Cardiovascular Exercise Tolerance: Good + Peripheral Vascular Disease  negative cardio ROS Normal cardiovascular exam Rate:Normal     Neuro/Psych negative neurological ROS  negative psych ROS   GI/Hepatic negative GI ROS, Neg liver ROS,   Endo/Other  negative endocrine ROS  Renal/GU negative Renal ROS  negative genitourinary   Musculoskeletal   Abdominal   Peds  Hematology negative hematology ROS (+)   Anesthesia Other Findings   Reproductive/Obstetrics negative OB ROS                             Anesthesia Physical Anesthesia Plan  ASA: II  Anesthesia Plan:    Post-op Pain Management:    Induction:   PONV Risk Score and Plan: 3 and Dexamethasone, Midazolam and Ondansetron  Airway Management Planned:   Additional Equipment:   Intra-op Plan:   Post-operative Plan:   Informed Consent: I have reviewed the patients History and Physical, chart, labs and discussed the procedure including the risks, benefits and alternatives for the proposed anesthesia with the patient or authorized representative who has indicated his/her understanding and acceptance.     Plan Discussed with: CRNA  Anesthesia Plan Comments:         Anesthesia Quick Evaluation

## 2017-10-24 NOTE — Anesthesia Post-op Follow-up Note (Signed)
Anesthesia QCDR form completed.        

## 2017-10-24 NOTE — Transfer of Care (Signed)
Immediate Anesthesia Transfer of Care Note  Patient: Natasha Yang  Procedure(s) Performed: EXCISION OF ABDOMINAL WALL ENDOMETRIUM (Right )  Patient Location: PACU  Anesthesia Type:General  Level of Consciousness: awake, alert  and oriented  Airway & Oxygen Therapy: Patient Spontanous Breathing and Patient connected to face mask oxygen  Post-op Assessment: Report given to RN  Post vital signs: Reviewed and stable  Last Vitals:  Vitals:   10/24/17 0909  BP: (!) 152/97  Pulse: 85  Resp: 18  Temp: 37.1 C  SpO2: 100%    Last Pain:  Vitals:   10/24/17 0909  TempSrc: Tympanic         Complications: No apparent anesthesia complications

## 2017-10-24 NOTE — Op Note (Signed)
Preoperative diagnosis: Suspected endometriomas of the lower abdominal wall.  Postoperative diagnosis: Same.  Operative procedure: Excision of abdominal wall endometriomas.  Operating Surgeon: Donnalee CurryJeffrey Johara Lodwick, MD.  Anesthesia: General by LMA, Marcaine 0.5% with 1-200,000 units of epinephrine, 30 cc.  Estimated blood loss: Less than 5 cc.  Clinical note: This 31 year old woman is developed a nodule in the lower abdominal wall.  She is 4 years status post emergency C-section.  Ultrasound was consistent with an endometrioma.  An additional nonpalpable lesion was identified medial to the index lesion in the lower abdomen.  She is admitted for elective excision.  Operative note: With the patient under adequate general anesthesia the abdomen was cleansed with ChloraPrep and draped.  Marcaine was infiltrated for postoperative analgesia.  A transverse incision was made over the masses that had been localized with ultrasound.  Ultrasound was used to identify the smaller medial lesion and both were marked.  A transverse incision was made over this area and the skin incised sharply and the remaining dissection completed with electrocautery.  The areas were excised down to and off the underlying abdominal wall fascia.  A clear border of normal adipose tissue was included.  The specimen was orientated with a short suture cephalad and a long suture laterally.  The specimen was sent in formalin for routine histology.  The adipose tissue was approximated with interrupted 2-0 Vicryl figure-of-eight sutures.  The superficial layer of adipose tissue was approximated with a running 3-0 Vicryl suture.  The skin was closed with a running 4-0 Vicryl subcuticular suture.  Benzoin, Steri-Strips, Telfa and Tegaderm dressing was applied.  The patient tolerated the procedure well and was taken to the recovery room in stable condition.

## 2017-10-24 NOTE — H&P (Signed)
No change in clinical history or exam.  For excision of abdominal wall endometriomas.

## 2017-10-24 NOTE — Discharge Instructions (Signed)
General Anesthesia, Adult, Care After °These instructions provide you with information about caring for yourself after your procedure. Your health care provider may also give you more specific instructions. Your treatment has been planned according to current medical practices, but problems sometimes occur. Call your health care provider if you have any problems or questions after your procedure. °What can I expect after the procedure? °After the procedure, it is common to have: °· Vomiting. °· A sore throat. °· Mental slowness. ° °It is common to feel: °· Nauseous. °· Cold or shivery. °· Sleepy. °· Tired. °· Sore or achy, even in parts of your body where you did not have surgery. ° °Follow these instructions at home: °For at least 24 hours after the procedure: °· Do not: °? Participate in activities where you could fall or become injured. °? Drive. °? Use heavy machinery. °? Drink alcohol. °? Take sleeping pills or medicines that cause drowsiness. °? Make important decisions or sign legal documents. °? Take care of children on your own. °· Rest. °Eating and drinking °· If you vomit, drink water, juice, or soup when you can drink without vomiting. °· Drink enough fluid to keep your urine clear or pale yellow. °· Make sure you have little or no nausea before eating solid foods. °· Follow the diet recommended by your health care provider. °General instructions °· Have a responsible adult stay with you until you are awake and alert. °· Return to your normal activities as told by your health care provider. Ask your health care provider what activities are safe for you. °· Take over-the-counter and prescription medicines only as told by your health care provider. °· If you smoke, do not smoke without supervision. °· Keep all follow-up visits as told by your health care provider. This is important. °Contact a health care provider if: °· You continue to have nausea or vomiting at home, and medicines are not helpful. °· You  cannot drink fluids or start eating again. °· You cannot urinate after 8-12 hours. °· You develop a skin rash. °· You have fever. °· You have increasing redness at the site of your procedure. °Get help right away if: °· You have difficulty breathing. °· You have chest pain. °· You have unexpected bleeding. °· You feel that you are having a life-threatening or urgent problem. °This information is not intended to replace advice given to you by your health care provider. Make sure you discuss any questions you have with your health care provider. °Document Released: 03/14/2001 Document Revised: 05/10/2016 Document Reviewed: 11/20/2015 °Elsevier Interactive Patient Education © 2018 [employer] ° °

## 2017-10-25 LAB — SURGICAL PATHOLOGY

## 2017-10-26 ENCOUNTER — Telehealth: Payer: Self-pay

## 2017-10-26 NOTE — Telephone Encounter (Signed)
-----   Message from Earline MayotteJeffrey W Byrnett, MD sent at 10/26/2017  8:56 AM EST ----- Please notify the patient path as expected: endometriomas.  F/U as planned for post op check. Thanks.  ----- Message ----- From: Interface, Lab In Three Zero One Sent: 10/25/2017   2:38 PM To: Earline MayotteJeffrey W Byrnett, MD

## 2017-10-26 NOTE — Telephone Encounter (Signed)
Notified patient as instructed, patient pleased. Discussed follow-up appointments, patient agrees  

## 2017-11-02 ENCOUNTER — Encounter: Payer: Self-pay | Admitting: General Surgery

## 2017-11-02 ENCOUNTER — Ambulatory Visit (INDEPENDENT_AMBULATORY_CARE_PROVIDER_SITE_OTHER): Payer: Managed Care, Other (non HMO) | Admitting: General Surgery

## 2017-11-02 VITALS — BP 120/80 | HR 82 | Resp 12 | Ht 66.0 in | Wt 244.0 lb

## 2017-11-02 DIAGNOSIS — N809 Endometriosis, unspecified: Secondary | ICD-10-CM

## 2017-11-02 DIAGNOSIS — N80129 Deep endometriosis of ovary, unspecified ovary: Secondary | ICD-10-CM

## 2017-11-02 NOTE — Patient Instructions (Signed)
Patient to return as needed. May massage the area. The patient is aware to call back for any questions or concerns.

## 2017-11-02 NOTE — Progress Notes (Signed)
Patient ID: Natasha DollyKelsey M Wingrove, female   DOB: 04/02/1986, 31 y.o.   MRN: [id number]  Chief Complaint  Patient presents with  . Routine Post Op    HPI Natasha Yang is a 31 y.o. female here today for her post op excision abdominal wall done 10/24/2017. Patient states she is doing well.  HPI  Past Medical History:  Diagnosis Date  . Anxiety   . Asthma    AS A CHILD -NO INHALERS  . Chickenpox   . Difficulty concentrating   . Hyperhydrosis disorder   . Thrombocytopenia (HCC)    DURING PREGNANCY IN 2015-RESOLVED AFTER DELIVERY    Past Surgical History:  Procedure Laterality Date  . CESAREAN SECTION  2015    Family History  Problem Relation Age of Onset  . Hypertension Mother   . Hypertension Father   . Melanoma Father   . Healthy Sister   . Healthy Brother   . Breast cancer Paternal Aunt   . Colon cancer Neg Hx     Social History Social History   Tobacco Use  . Smoking status: Never Smoker  . Smokeless tobacco: Never Used  Substance Use Topics  . Alcohol use: Yes    Alcohol/week: 0.0 oz    Comment: social  . Drug use: No    No Known Allergies  Current Outpatient Medications  Medication Sig Dispense Refill  . clonazePAM (KLONOPIN) 1 MG tablet Take 1 mg by mouth daily as needed for anxiety. ONLY TAKES WHEN TRAVELING    . hydrochlorothiazide (HYDRODIURIL) 50 MG tablet Take 50 mg by mouth daily. TAKES FOR SWELLING IN HANDS    . levonorgestrel (MIRENA) 20 MCG/24HR IUD 1 each by Intrauterine route once.     . venlafaxine XR (EFFEXOR-XR) 37.5 MG 24 hr capsule Take 37.5 mg by mouth daily with breakfast.     No current facility-administered medications for this visit.     Review of Systems Review of Systems  Constitutional: Negative.   Respiratory: Negative.   Cardiovascular: Negative.     Blood pressure 120/80, pulse 82, resp. rate 12, height 5\' 6"  (1.676 m), weight 244 lb (110.7 kg).  Physical Exam Physical Exam  Constitutional: She is oriented to  person, place, and time. She appears well-developed and well-nourished.  Abdominal:    Neurological: She is alert and oriented to person, place, and time.  Skin: Skin is warm and dry.    Data Reviewed October 24, 2017 pathology: DIAGNOSIS:  A. ABDOMINAL NODULES, RIGHT LOWER WALL; EXCISION:  - CONSISTENT WITH ENDOMETRIOMAS.   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    Plan     Patient may use local massage to the area to help resolve residual thickening of the scar.    Patient to return as needed. May massage the area. The patient is aware to call back for any questions or concerns.   HPI, Physical Exam, Assessment and Plan have been scribed under the direction and in the presence of Natasha CurryJeffrey Landrie Beale, MD.  Ples SpecterJessica Qualls, CMA  I have completed the exam and reviewed the above documentation for accuracy and completeness.  I agree with the above.  [employer] has been used and any errors in dictation or transcription are unintentional.  Natasha Yang, M.D., F.A.C.S.  Earline MayotteByrnett, Legion Discher W 11/02/2017, 4:41 PM

## 2017-11-07 NOTE — Anesthesia Postprocedure Evaluation (Signed)
Anesthesia Post Note  Patient: Natasha Yang  Procedure(s) Performed: EXCISION OF ABDOMINAL WALL ENDOMETRIUM (Right )  Patient location during evaluation: PACU Anesthesia Type: General Level of consciousness: awake and alert Pain management: pain level controlled Vital Signs Assessment: post-procedure vital signs reviewed and stable Respiratory status: spontaneous breathing, nonlabored ventilation, respiratory function stable and patient connected to nasal cannula oxygen Cardiovascular status: blood pressure returned to baseline and stable Postop Assessment: no apparent nausea or vomiting Anesthetic complications: no     Last Vitals:  Vitals:   10/24/17 1154 10/24/17 1219  BP: 134/85 133/73  Pulse: 60 (!) 58  Resp: 12 12  Temp:    SpO2: 100% 100%    Last Pain:  Vitals:   10/25/17 1011  TempSrc:   PainSc: 0-No pain                 Yevette EdwardsJames G Adams

## 2017-12-17 ENCOUNTER — Ambulatory Visit (INDEPENDENT_AMBULATORY_CARE_PROVIDER_SITE_OTHER): Payer: Managed Care, Other (non HMO) | Admitting: Family Medicine

## 2017-12-17 VITALS — BP 124/78 | HR 74 | Temp 98.7°F | Resp 14 | Wt 248.2 lb

## 2017-12-17 DIAGNOSIS — J329 Chronic sinusitis, unspecified: Secondary | ICD-10-CM | POA: Diagnosis not present

## 2017-12-17 MED ORDER — FLUTICASONE PROPIONATE 50 MCG/ACT NA SUSP: 2.0000 | Freq: Every day | NASAL | 6 refills | Status: DC

## 2017-12-17 MED ORDER — AMOXICILLIN 500 MG PO CAPS: 1000.0000 mg | ORAL_CAPSULE | Freq: Three times a day (TID) | ORAL | 0 refills | Status: AC

## 2017-12-17 NOTE — Progress Notes (Signed)
Patient: Natasha Yang Female    DOB: 05/07/1986   31 y.o.   MRN: [id number] Visit Date: 12/17/2017  Today's Provider: Mila Merryonald Sade Mehlhoff, MD   Chief Complaint  Patient presents with  . Sinus Problem   Subjective:    HPI  Patient states that she started to get symptoms of allergy like Tuesday 12/13/17. Started with runny nose, sneezing, congestion. She felt like she was getting over it after a few days of using Zicam, but symptoms returned yesterday and are progressively getting worse. And now developed ear itchiness, discomfort, pressure and left side of her head and temple area pressure. Patient did try OTC allergy medication at beginning and also took DayQuil. She is not taking allergy medications on regular.    No Known Allergies   Current Outpatient Medications:  .  Cyanocobalamin (VITAMIN B-12 PO), Take by mouth daily., Disp: , Rfl:  .  hydrochlorothiazide (HYDRODIURIL) 50 MG tablet, Take 50 mg by mouth daily. TAKES FOR SWELLING IN HANDS, Disp: , Rfl:  .  levonorgestrel (MIRENA) 20 MCG/24HR IUD, 1 each by Intrauterine route once. , Disp: , Rfl:  .  venlafaxine XR (EFFEXOR-XR) 37.5 MG 24 hr capsule, Take 37.5 mg by mouth daily with breakfast., Disp: , Rfl:  .  clonazePAM (KLONOPIN) 1 MG tablet, Take 1 mg by mouth daily as needed for anxiety. ONLY TAKES WHEN TRAVELING, Disp: , Rfl:   Review of Systems  Constitutional: Positive for diaphoresis and fatigue.  HENT: Positive for congestion, ear pain, postnasal drip, rhinorrhea, sinus pressure and sneezing. Negative for ear discharge.   Respiratory: Positive for cough (dry cough). Negative for apnea, chest tightness, shortness of breath and wheezing.   Cardiovascular: Negative.   Gastrointestinal: Negative for nausea and vomiting.  Musculoskeletal: Negative.   Neurological: Positive for headaches. Negative for dizziness and light-headedness.    Social History   Tobacco Use  . Smoking status: Never Smoker  . Smokeless  tobacco: Never Used  Substance Use Topics  . Alcohol use: Yes    Alcohol/week: 0.0 oz    Comment: social   Objective:   BP 124/78   Pulse 74   Temp 98.7 F (37.1 C)   Resp 14   Wt 248 lb 3.2 oz (112.6 kg)   SpO2 96%   BMI 40.06 kg/m  Vitals:   12/17/17 1023  BP: 124/78  Pulse: 74  Resp: 14  Temp: 98.7 F (37.1 C)  SpO2: 96%  Weight: 248 lb 3.2 oz (112.6 kg)     Physical Exam  General Appearance:    Alert, cooperative, no distress  HENT:   bilateral TM normal without fluid or infection, neck without nodes, throat normal without erythema or exudate, frontal and maxillary sinuses tender and nasal mucosa congested  Eyes:    PERRL, conjunctiva/corneas clear, EOM's intact       Lungs:     Clear to auscultation bilaterally, respirations unlabored  Heart:    Regular rate and rhythm  Neurologic:   Awake, alert, oriented x 3. No apparent focal neurological           defect.           Assessment & Plan:     1. Sinusitis, unspecified chronicity, unspecified location  - amoxicillin (AMOXIL) 500 MG capsule; Take 2 capsules (1,000 mg total) by mouth 3 (three) times daily for 5 days.  Dispense: 30 capsule; Refill: 0 - fluticasone (FLONASE) 50 MCG/ACT nasal spray; Place 2 sprays into both  nostrils daily.  Dispense: 16 g; Refill: 6  Call if symptoms change or if not rapidly improving.          Mila Merryonald Colbert Curenton, MD  [facility]

## 2019-01-01 ENCOUNTER — Ambulatory Visit
Admission: RE | Admit: 2019-01-01 | Discharge: 2019-01-01 | Disposition: A | Discharge status: Home / Self Care | Payer: BLUE CROSS/BLUE SHIELD | Source: Ambulatory Visit | Attending: Family Medicine | Admitting: Family Medicine

## 2019-01-01 ENCOUNTER — Ambulatory Visit: Payer: BLUE CROSS/BLUE SHIELD | Admitting: Family Medicine

## 2019-01-01 ENCOUNTER — Ambulatory Visit
Admission: RE | Admit: 2019-01-01 | Discharge: 2019-01-01 | Disposition: A | Discharge status: Home / Self Care | Payer: BLUE CROSS/BLUE SHIELD | Attending: Family Medicine | Admitting: Family Medicine

## 2019-01-01 ENCOUNTER — Encounter: Payer: Self-pay | Admitting: Family Medicine

## 2019-01-01 VITALS — BP 132/90 | HR 78 | Temp 98.2°F | Wt 249.0 lb

## 2019-01-01 DIAGNOSIS — S99921A Unspecified injury of right foot, initial encounter: Secondary | ICD-10-CM | POA: Insufficient documentation

## 2019-01-01 NOTE — Patient Instructions (Signed)
Start two Aleve twice daily with food. Continue icing for 20 minutes several x today. We will call you with the x-ray result.

## 2019-01-01 NOTE — Progress Notes (Signed)
  Subjective:     Patient ID: Natasha Yang, female   DOB: 02-28-1986, 33 y.o.   MRN: [id number] Chief Complaint  Patient presents with  . Toe Injury    Patient presents today for right big toe injury that happened on yesterday 12/31/2018. Patient stated that she was trying to get out of bed and may have twisted her toe. Patient states she had swelling and its very painful to walk. Patient has applied ice to the area and it helped reduce the swelling.   HPI States she was backing off her bed feet first when she realized her dog was at her feet. In the process of avoiding her dog she believes she may have twisted her toe.  Review of Systems     Objective:   Physical Exam Constitutional:      General: She is not in acute distress. Musculoskeletal:     Comments: Right great toe without deformity or significant swelling. Localizes pain to the MTP joint area though minimally tender to the touch. 5/5 DF/PF but increased pain when doing so.  Neurological:     Mental Status: She is alert.        Assessment:    1. Injury of right great toe, initial encounter - DG Toe Great Right; Future    Plan:    Discussed use of nsaid's and continued icing today. Further f/u pending x-ray result.

## 2019-02-21 ENCOUNTER — Ambulatory Visit: Payer: BLUE CROSS/BLUE SHIELD | Admitting: Physician Assistant

## 2019-02-21 ENCOUNTER — Encounter: Payer: Self-pay | Admitting: Physician Assistant

## 2019-02-21 VITALS — BP 127/88 | HR 108 | Temp 99.2°F | Resp 16 | Wt 244.8 lb

## 2019-02-21 DIAGNOSIS — J111 Influenza due to unidentified influenza virus with other respiratory manifestations: Secondary | ICD-10-CM

## 2019-02-21 MED ORDER — OSELTAMIVIR PHOSPHATE 75 MG PO CAPS: 75.0000 mg | ORAL_CAPSULE | Freq: Two times a day (BID) | ORAL | 0 refills | Status: AC

## 2019-02-21 NOTE — Patient Instructions (Signed)

## 2019-02-21 NOTE — Progress Notes (Signed)
Patient: Natasha Yang Female    DOB: [birth year]   33 y.o.   MRN: [id number] Visit Date: 02/21/2019  Today's Provider: Trey Sailors, PA-C   Chief Complaint  Patient presents with  . URI   Subjective:    I,Joseline E. Rosas,RMA am acting as a Neurosurgeon for [employer], PA-C.   URI   This is a new problem. The current episode started yesterday. The problem has been unchanged. The maximum temperature recorded prior to her arrival was 102 - 102.9 F (102.8 in the afternoon after she took Tylenol it was 100.4). Associated symptoms include coughing, ear pain and headaches. Pertinent negatives include no chest pain, vomiting or wheezing. Associated symptoms comments: "aches". She has tried acetaminophen and increased fluids for the symptoms. The treatment provided no relief.  She reports that she was at [place] last week but that her daughter had the flu before traveling. She came back last Friday.   No Known Allergies   Current Outpatient Medications:  .  clonazePAM (KLONOPIN) 1 MG tablet, Take 1 mg by mouth daily as needed for anxiety. ONLY TAKES WHEN TRAVELING, Disp: , Rfl:  .  Cyanocobalamin (VITAMIN B-12 PO), Take by mouth daily., Disp: , Rfl:  .  fluticasone (FLONASE) 50 MCG/ACT nasal spray, Place 2 sprays into both nostrils daily. (Patient not taking: Reported on 02/21/2019), Disp: 16 g, Rfl: 6 .  hydrochlorothiazide (HYDRODIURIL) 50 MG tablet, Take 50 mg by mouth daily. TAKES FOR SWELLING IN HANDS, Disp: , Rfl:  .  venlafaxine XR (EFFEXOR-XR) 37.5 MG 24 hr capsule, Take 37.5 mg by mouth daily with breakfast., Disp: , Rfl:   Review of Systems  Constitutional: Positive for fatigue and fever.  HENT: Positive for ear pain.   Respiratory: Positive for cough and chest tightness ("chest hurts when she takes deep breath"). Negative for shortness of breath and wheezing.   Cardiovascular: Negative for chest pain, palpitations and leg swelling.  Gastrointestinal: Negative for  vomiting.  Neurological: Positive for headaches.    Social History   Tobacco Use  . Smoking status: Never Smoker  . Smokeless tobacco: Never Used  Substance Use Topics  . Alcohol use: Yes    Alcohol/week: 0.0 standard drinks    Comment: social      Objective:   BP 127/88 (BP Location: Left Arm, Patient Position: Sitting, Cuff Size: Large)   Pulse (!) 108   Temp 99.2 F (37.3 C) (Oral)   Resp 16   Wt 244 lb 12.8 oz (111 kg)   BMI 39.51 kg/m  Vitals:   02/21/19 1200  BP: 127/88  Pulse: (!) 108  Resp: 16  Temp: 99.2 F (37.3 C)  TempSrc: Oral  Weight: 244 lb 12.8 oz (111 kg)     Physical Exam Constitutional:      Appearance: Normal appearance. She is ill-appearing.  HENT:     Right Ear: Tympanic membrane and ear canal normal.     Left Ear: Tympanic membrane and ear canal normal.     Nose: Nose normal.     Mouth/Throat:     Mouth: Mucous membranes are moist.     Pharynx: Posterior oropharyngeal erythema present. No oropharyngeal exudate.  Cardiovascular:     Rate and Rhythm: Regular rhythm. Tachycardia present.     Heart sounds: Normal heart sounds.  Pulmonary:     Effort: Pulmonary effort is normal.     Breath sounds: Normal breath sounds.  Lymphadenopathy:     Cervical:  Cervical adenopathy present.  Skin:    General: Skin is warm and dry.  Neurological:     Mental Status: She is alert and oriented to person, place, and time. Mental status is at baseline.  Psychiatric:        Mood and Affect: Mood normal.        Behavior: Behavior normal.         Assessment & Plan    1. Influenza  Treat as below, work note provided.   - oseltamivir (TAMIFLU) 75 MG capsule; Take 1 capsule (75 mg total) by mouth 2 (two) times daily for 5 days.  Dispense: 10 capsule; Refill: 0  The entirety of the information documented in the History of Present Illness, Review of Systems and Physical Exam were personally obtained by me. Portions of this information were initially  documented by Hetty Ely, CMA and reviewed by me for thoroughness and accuracy.   Return if symptoms worsen or fail to improve.        Trey Sailors, PA-C  [facility]

## 2019-02-23 ENCOUNTER — Encounter: Payer: Self-pay | Admitting: Physician Assistant

## 2019-03-03 DIAGNOSIS — Z713 Dietary counseling and surveillance: Secondary | ICD-10-CM | POA: Diagnosis not present

## 2019-03-03 DIAGNOSIS — L74512 Primary focal hyperhidrosis, palms: Secondary | ICD-10-CM | POA: Diagnosis not present

## 2019-03-03 DIAGNOSIS — I10 Essential (primary) hypertension: Secondary | ICD-10-CM | POA: Diagnosis not present

## 2019-03-03 DIAGNOSIS — L74510 Primary focal hyperhidrosis, axilla: Secondary | ICD-10-CM | POA: Diagnosis not present

## 2019-03-03 DIAGNOSIS — L74513 Primary focal hyperhidrosis, soles: Secondary | ICD-10-CM | POA: Diagnosis not present

## 2019-03-07 ENCOUNTER — Encounter: Payer: Self-pay | Admitting: Physician Assistant

## 2019-05-21 ENCOUNTER — Encounter: Payer: Self-pay | Admitting: Physician Assistant

## 2019-05-21 ENCOUNTER — Other Ambulatory Visit: Payer: Self-pay

## 2019-05-21 ENCOUNTER — Ambulatory Visit: Payer: BLUE CROSS/BLUE SHIELD | Admitting: Physician Assistant

## 2019-05-21 VITALS — BP 138/86 | HR 75 | Temp 98.5°F | Resp 16 | Wt 247.2 lb

## 2019-05-21 DIAGNOSIS — S30860A Insect bite (nonvenomous) of lower back and pelvis, initial encounter: Secondary | ICD-10-CM | POA: Diagnosis not present

## 2019-05-21 DIAGNOSIS — L0390 Cellulitis, unspecified: Secondary | ICD-10-CM

## 2019-05-21 DIAGNOSIS — W57XXXA Bitten or stung by nonvenomous insect and other nonvenomous arthropods, initial encounter: Secondary | ICD-10-CM | POA: Diagnosis not present

## 2019-05-21 MED ORDER — DOXYCYCLINE HYCLATE 100 MG PO TABS: 100.0000 mg | ORAL_TABLET | Freq: Two times a day (BID) | ORAL | 0 refills | Status: DC

## 2019-05-21 NOTE — Patient Instructions (Signed)

## 2019-05-21 NOTE — Progress Notes (Signed)
Patient: Natasha Yang Female    DOB: 12/12/1986   33 y.o.   MRN: [id number] Visit Date: 05/21/2019  Today's Provider: Margaretann LovelessJennifer M , PA-C   Chief Complaint  Patient presents with  . Tick Removal   Subjective:     HPI  Patient here today because she had a tick removed last night. She is not sure if they got it all out. Reports that it started to itch. It has some swelling and redness. She is concern about lyme disease because it had been there for a least a month. When she first noticed it, it was small and black. She thought it was a mole as she has issues with mole development. She had even called to schedule an appointment with her dermatologist. She reports then yesterday she was at the pool with her mother and had her mother look at it, since she could not see it, just feel it. Her mother told her it was a tick that was dead and helped remove it for her. She did bring the tick with her today. It is engorged but head is intact on the body of the tick. It is a brown dog tick.   No Known Allergies   Current Outpatient Medications:  .  clonazePAM (KLONOPIN) 1 MG tablet, Take 1 mg by mouth daily as needed for anxiety. ONLY TAKES WHEN TRAVELING, Disp: , Rfl:  .  lisinopril-hydrochlorothiazide (ZESTORETIC) 20-25 MG tablet, , Disp: , Rfl:  .  Cyanocobalamin (VITAMIN B-12 PO), Take by mouth daily., Disp: , Rfl:   Review of Systems  Constitutional: Negative.   Respiratory: Negative.   Cardiovascular: Negative.   Musculoskeletal: Negative.   Skin: Positive for wound.  Neurological: Negative.     Social History   Tobacco Use  . Smoking status: Never Smoker  . Smokeless tobacco: Never Used  Substance Use Topics  . Alcohol use: Yes    Alcohol/week: 0.0 standard drinks    Comment: social      Objective:   BP 138/86 (BP Location: Left Arm, Patient Position: Sitting, Cuff Size: Large)   Pulse 75   Temp 98.5 F (36.9 C) (Oral)   Resp 16   Wt 247 lb 3.2 oz  (112.1 kg)   BMI 39.90 kg/m  Vitals:   05/21/19 1018  BP: 138/86  Pulse: 75  Resp: 16  Temp: 98.5 F (36.9 C)  TempSrc: Oral  Weight: 247 lb 3.2 oz (112.1 kg)     Physical Exam Vitals signs reviewed.  Constitutional:      General: She is not in acute distress.    Appearance: Normal appearance. She is well-developed. She is not ill-appearing or diaphoretic.  Neck:     Musculoskeletal: Normal range of motion and neck supple.     Thyroid: No thyromegaly.     Vascular: No JVD.     Trachea: No tracheal deviation.  Cardiovascular:     Rate and Rhythm: Normal rate and regular rhythm.     Heart sounds: Normal heart sounds. No murmur. No friction rub. No gallop.   Pulmonary:     Effort: Pulmonary effort is normal. No respiratory distress.     Breath sounds: Normal breath sounds. No wheezing or rales.  Lymphadenopathy:     Cervical: No cervical adenopathy.  Skin:    Findings: Wound present.       Neurological:     Mental Status: She is alert.         Assessment &  Plan    1. Wound cellulitis Small area of wound cellulitis surrounding tick bite. Advised to use benadryl cream OTC for itching. Doxycycline will be prescribed as below. Call if worsening or signs/symptoms of tick borne illness arise.  - doxycycline (VIBRA-TABS) 100 MG tablet; Take 1 tablet (100 mg total) by mouth 2 (two) times daily.  Dispense: 14 tablet; Refill: 0  2. Tick bite of lower back, initial encounter See above medical treatment plan. - doxycycline (VIBRA-TABS) 100 MG tablet; Take 1 tablet (100 mg total) by mouth 2 (two) times daily.  Dispense: 14 tablet; Refill: 0     Margaretann Loveless, PA-C  [facility]

## 2019-05-26 DIAGNOSIS — L74510 Primary focal hyperhidrosis, axilla: Secondary | ICD-10-CM | POA: Diagnosis not present

## 2019-05-26 DIAGNOSIS — Z79899 Other long term (current) drug therapy: Secondary | ICD-10-CM | POA: Diagnosis not present

## 2019-05-26 DIAGNOSIS — E669 Obesity, unspecified: Secondary | ICD-10-CM | POA: Diagnosis not present

## 2019-05-26 DIAGNOSIS — L74513 Primary focal hyperhidrosis, soles: Secondary | ICD-10-CM | POA: Diagnosis not present

## 2019-05-26 DIAGNOSIS — I10 Essential (primary) hypertension: Secondary | ICD-10-CM | POA: Diagnosis not present

## 2019-05-26 DIAGNOSIS — F419 Anxiety disorder, unspecified: Secondary | ICD-10-CM | POA: Diagnosis not present

## 2019-05-26 DIAGNOSIS — Z719 Counseling, unspecified: Secondary | ICD-10-CM | POA: Diagnosis not present

## 2019-05-26 DIAGNOSIS — L74512 Primary focal hyperhidrosis, palms: Secondary | ICD-10-CM | POA: Diagnosis not present

## 2019-05-26 DIAGNOSIS — Z713 Dietary counseling and surveillance: Secondary | ICD-10-CM | POA: Diagnosis not present

## 2019-08-02 ENCOUNTER — Encounter: Payer: Self-pay | Admitting: Physician Assistant

## 2019-08-07 DIAGNOSIS — N979 Female infertility, unspecified: Secondary | ICD-10-CM | POA: Diagnosis not present

## 2019-08-07 DIAGNOSIS — Z6841 Body Mass Index (BMI) 40.0 and over, adult: Secondary | ICD-10-CM | POA: Diagnosis not present

## 2019-10-09 DIAGNOSIS — Z1331 Encounter for screening for depression: Secondary | ICD-10-CM | POA: Diagnosis not present

## 2019-10-09 DIAGNOSIS — Z124 Encounter for screening for malignant neoplasm of cervix: Secondary | ICD-10-CM | POA: Diagnosis not present

## 2019-10-09 DIAGNOSIS — Z1151 Encounter for screening for human papillomavirus (HPV): Secondary | ICD-10-CM | POA: Diagnosis not present

## 2019-10-09 DIAGNOSIS — E538 Deficiency of other specified B group vitamins: Secondary | ICD-10-CM | POA: Diagnosis not present

## 2019-10-09 DIAGNOSIS — E559 Vitamin D deficiency, unspecified: Secondary | ICD-10-CM | POA: Diagnosis not present

## 2019-10-09 DIAGNOSIS — Z01419 Encounter for gynecological examination (general) (routine) without abnormal findings: Secondary | ICD-10-CM | POA: Diagnosis not present

## 2019-10-09 DIAGNOSIS — Z3169 Encounter for other general counseling and advice on procreation: Secondary | ICD-10-CM | POA: Diagnosis not present

## 2019-10-09 DIAGNOSIS — R609 Edema, unspecified: Secondary | ICD-10-CM | POA: Diagnosis not present

## 2019-10-09 DIAGNOSIS — Z23 Encounter for immunization: Secondary | ICD-10-CM | POA: Diagnosis not present

## 2019-10-12 DIAGNOSIS — R600 Localized edema: Secondary | ICD-10-CM | POA: Insufficient documentation

## 2019-10-12 DIAGNOSIS — R001 Bradycardia, unspecified: Secondary | ICD-10-CM | POA: Diagnosis not present

## 2019-10-12 DIAGNOSIS — R002 Palpitations: Secondary | ICD-10-CM | POA: Insufficient documentation

## 2019-10-12 DIAGNOSIS — I10 Essential (primary) hypertension: Secondary | ICD-10-CM | POA: Diagnosis not present

## 2019-10-12 DIAGNOSIS — Z7689 Persons encountering health services in other specified circumstances: Secondary | ICD-10-CM | POA: Diagnosis not present

## 2019-10-12 DIAGNOSIS — R030 Elevated blood-pressure reading, without diagnosis of hypertension: Secondary | ICD-10-CM | POA: Diagnosis not present

## 2019-10-12 HISTORY — DX: Palpitations: R00.2

## 2019-10-12 HISTORY — DX: Localized edema: R60.0

## 2019-10-12 HISTORY — DX: Essential (primary) hypertension: I10

## 2019-10-16 DIAGNOSIS — E538 Deficiency of other specified B group vitamins: Secondary | ICD-10-CM | POA: Diagnosis not present

## 2019-10-16 DIAGNOSIS — E559 Vitamin D deficiency, unspecified: Secondary | ICD-10-CM | POA: Diagnosis not present

## 2019-10-26 DIAGNOSIS — R600 Localized edema: Secondary | ICD-10-CM | POA: Diagnosis not present

## 2019-10-27 DIAGNOSIS — F419 Anxiety disorder, unspecified: Secondary | ICD-10-CM | POA: Diagnosis not present

## 2019-10-27 DIAGNOSIS — L74512 Primary focal hyperhidrosis, palms: Secondary | ICD-10-CM | POA: Diagnosis not present

## 2019-10-27 DIAGNOSIS — L74510 Primary focal hyperhidrosis, axilla: Secondary | ICD-10-CM | POA: Diagnosis not present

## 2019-10-27 DIAGNOSIS — I10 Essential (primary) hypertension: Secondary | ICD-10-CM | POA: Diagnosis not present

## 2019-10-30 DIAGNOSIS — E538 Deficiency of other specified B group vitamins: Secondary | ICD-10-CM | POA: Diagnosis not present

## 2019-10-30 DIAGNOSIS — R002 Palpitations: Secondary | ICD-10-CM | POA: Diagnosis not present

## 2019-10-31 DIAGNOSIS — R600 Localized edema: Secondary | ICD-10-CM | POA: Diagnosis not present

## 2019-10-31 DIAGNOSIS — R002 Palpitations: Secondary | ICD-10-CM | POA: Diagnosis not present

## 2019-10-31 DIAGNOSIS — I10 Essential (primary) hypertension: Secondary | ICD-10-CM | POA: Diagnosis not present

## 2019-11-13 DIAGNOSIS — E538 Deficiency of other specified B group vitamins: Secondary | ICD-10-CM | POA: Diagnosis not present

## 2019-11-13 DIAGNOSIS — N839 Noninflammatory disorder of ovary, fallopian tube and broad ligament, unspecified: Secondary | ICD-10-CM | POA: Diagnosis not present

## 2019-11-21 DIAGNOSIS — N839 Noninflammatory disorder of ovary, fallopian tube and broad ligament, unspecified: Secondary | ICD-10-CM | POA: Diagnosis not present

## 2019-11-23 DIAGNOSIS — N839 Noninflammatory disorder of ovary, fallopian tube and broad ligament, unspecified: Secondary | ICD-10-CM | POA: Diagnosis not present

## 2019-11-27 DIAGNOSIS — E538 Deficiency of other specified B group vitamins: Secondary | ICD-10-CM | POA: Diagnosis not present

## 2019-12-11 DIAGNOSIS — E538 Deficiency of other specified B group vitamins: Secondary | ICD-10-CM | POA: Diagnosis not present

## 2019-12-12 DIAGNOSIS — R1084 Generalized abdominal pain: Secondary | ICD-10-CM | POA: Diagnosis not present

## 2019-12-12 DIAGNOSIS — Z3200 Encounter for pregnancy test, result unknown: Secondary | ICD-10-CM | POA: Diagnosis not present

## 2019-12-12 DIAGNOSIS — N839 Noninflammatory disorder of ovary, fallopian tube and broad ligament, unspecified: Secondary | ICD-10-CM | POA: Diagnosis not present

## 2019-12-25 DIAGNOSIS — E538 Deficiency of other specified B group vitamins: Secondary | ICD-10-CM | POA: Diagnosis not present

## 2019-12-28 DIAGNOSIS — I10 Essential (primary) hypertension: Secondary | ICD-10-CM | POA: Diagnosis not present

## 2019-12-28 DIAGNOSIS — F419 Anxiety disorder, unspecified: Secondary | ICD-10-CM | POA: Diagnosis not present

## 2019-12-28 DIAGNOSIS — L74510 Primary focal hyperhidrosis, axilla: Secondary | ICD-10-CM | POA: Diagnosis not present

## 2019-12-28 DIAGNOSIS — L74512 Primary focal hyperhidrosis, palms: Secondary | ICD-10-CM | POA: Diagnosis not present

## 2020-01-03 DIAGNOSIS — N839 Noninflammatory disorder of ovary, fallopian tube and broad ligament, unspecified: Secondary | ICD-10-CM | POA: Diagnosis not present

## 2020-04-03 DIAGNOSIS — N3000 Acute cystitis without hematuria: Secondary | ICD-10-CM | POA: Diagnosis not present

## 2020-04-03 DIAGNOSIS — R309 Painful micturition, unspecified: Secondary | ICD-10-CM | POA: Diagnosis not present

## 2020-05-06 DIAGNOSIS — Z113 Encounter for screening for infections with a predominantly sexual mode of transmission: Secondary | ICD-10-CM | POA: Diagnosis not present

## 2020-05-06 DIAGNOSIS — Z30430 Encounter for insertion of intrauterine contraceptive device: Secondary | ICD-10-CM | POA: Diagnosis not present

## 2020-05-06 DIAGNOSIS — Z3009 Encounter for other general counseling and advice on contraception: Secondary | ICD-10-CM | POA: Diagnosis not present

## 2020-06-03 DIAGNOSIS — Z30431 Encounter for routine checking of intrauterine contraceptive device: Secondary | ICD-10-CM | POA: Diagnosis not present

## 2020-07-04 DIAGNOSIS — I10 Essential (primary) hypertension: Secondary | ICD-10-CM | POA: Diagnosis not present

## 2020-07-04 DIAGNOSIS — F419 Anxiety disorder, unspecified: Secondary | ICD-10-CM | POA: Diagnosis not present

## 2020-07-04 DIAGNOSIS — L74512 Primary focal hyperhidrosis, palms: Secondary | ICD-10-CM | POA: Diagnosis not present

## 2020-07-04 DIAGNOSIS — L74510 Primary focal hyperhidrosis, axilla: Secondary | ICD-10-CM | POA: Diagnosis not present

## 2020-07-04 DIAGNOSIS — L74513 Primary focal hyperhidrosis, soles: Secondary | ICD-10-CM | POA: Diagnosis not present

## 2020-08-07 ENCOUNTER — Telehealth (INDEPENDENT_AMBULATORY_CARE_PROVIDER_SITE_OTHER): Payer: BC Managed Care – PPO | Admitting: Physician Assistant

## 2020-08-07 DIAGNOSIS — R0981 Nasal congestion: Secondary | ICD-10-CM | POA: Diagnosis not present

## 2020-08-07 NOTE — Progress Notes (Signed)
MyChart Video Visit    Virtual Visit via Video Note   This visit type was conducted due to national recommendations for restrictions regarding the COVID-19 Pandemic (e.g. social distancing) in an effort to limit this patient's exposure and mitigate transmission in our community. This patient is at least at moderate risk for complications without adequate follow up. This format is felt to be most appropriate for this patient at this time. Physical exam was limited by quality of the video and audio technology used for the visit.   Patient location: Home Provider location: Office    I discussed the limitations of evaluation and management by telemedicine and the availability of in person appointments. The patient expressed understanding and agreed to proceed.   Patient: Natasha Yang   DOB: Jun 09, 1986   34 y.o. Female  MRN: [id number] Visit Date: 08/07/2020  Today's healthcare provider: Trey Sailors, PA-C   Chief Complaint  Patient presents with  . Sinus Problem   Subjective    Sinus Problem This is a new problem. The current episode started in the past 7 days. The problem has been gradually worsening since onset. There has been no fever. Her pain is at a severity of 3/10. The pain is mild. Associated symptoms include congestion, ear pain (pressure mainly per pt), headaches, sinus pressure and sneezing. Pertinent negatives include no chills, coughing, hoarse voice, shortness of breath or sore throat. Past treatments include oral decongestants. The treatment provided mild relief.  Patient reports that she lost her taste and smell. Patient is vaccinated against COVID, second dose last month. She has been taking Mucinex sinus and sudafed today.     Medications: Outpatient Medications Prior to Visit  Medication Sig  . clonazePAM (KLONOPIN) 1 MG tablet Take 1 mg by mouth daily as needed for anxiety. ONLY TAKES WHEN TRAVELING  . Cyanocobalamin (VITAMIN B-12 PO) Take by mouth  daily. (Patient not taking: Reported on 08/07/2020)  . lisinopril-hydrochlorothiazide (ZESTORETIC) 20-25 MG tablet  (Patient not taking: Reported on 08/07/2020)  . [DISCONTINUED] doxycycline (VIBRA-TABS) 100 MG tablet Take 1 tablet (100 mg total) by mouth 2 (two) times daily.   No facility-administered medications prior to visit.    Review of Systems  Constitutional: Negative for chills, fatigue and fever.  HENT: Positive for congestion, ear pain (pressure mainly per pt), sinus pressure, sinus pain and sneezing. Negative for hoarse voice, postnasal drip, rhinorrhea, sore throat and trouble swallowing.   Eyes: Negative for pain, discharge and itching.  Respiratory: Negative for cough, chest tightness, shortness of breath and wheezing.   Cardiovascular: Negative for chest pain.  Neurological: Positive for headaches. Negative for weakness.      Objective    There were no vitals taken for this visit.   Physical Exam Constitutional:      Appearance: Normal appearance.  Pulmonary:     Effort: Pulmonary effort is normal. No respiratory distress.  Neurological:     Mental Status: She is alert.  Psychiatric:        Mood and Affect: Mood normal.        Behavior: Behavior normal.        Assessment & Plan    1. Sinus congestion  - symptoms and exam c/w viral URI  - no evidence of strep pharyngitis, CAP, AOM, bacterial sinusitis, or other bacterial infection - concern for possible COVID19 infection - will send for outpatient testing - discussed need to quarantine 10 days from start of symptoms and until fever-free for at  least 48 hours - discussed need to quarantine household members - discussed symptomatic management, natural course, and return precautions     No follow-ups on file.     I discussed the assessment and treatment plan with the patient. The patient was provided an opportunity to ask questions and all were answered. The patient agreed with the plan and demonstrated  an understanding of the instructions.   The patient was advised to call back or seek an in-person evaluation if the symptoms worsen or if the condition fails to improve as anticipated.   The entirety of the information documented in the History of Present Illness, Review of Systems and Physical Exam were personally obtained by me. Portions of this information were initially documented by [facility] and reviewed by me for thoroughness and accuracy.   ITrey Sailors, PA-C, have reviewed all documentation for this visit. The documentation on 08/19/20 for the exam, diagnosis, procedures, and orders are all accurate and complete.   Maryella Shivers [facility] [phone] (phone) [phone] (fax)  [facility]

## 2020-08-08 ENCOUNTER — Other Ambulatory Visit: Payer: Self-pay | Admitting: Sleep Medicine

## 2020-08-08 ENCOUNTER — Other Ambulatory Visit: Payer: Self-pay

## 2020-08-08 DIAGNOSIS — Z20822 Contact with and (suspected) exposure to covid-19: Secondary | ICD-10-CM

## 2020-08-09 LAB — NOVEL CORONAVIRUS, NAA: SARS-CoV-2, NAA: DETECTED — AB

## 2020-08-09 LAB — SARS-COV-2, NAA 2 DAY TAT

## 2020-08-10 ENCOUNTER — Telehealth: Payer: Self-pay | Admitting: Physician Assistant

## 2020-08-10 NOTE — Telephone Encounter (Signed)
Called to discuss with patient about Covid symptoms and the use of casirivimab/imdevimab, a monoclonal antibody infusion for those with mild to moderate Covid symptoms and at a high risk of hospitalization.  Pt is qualified for this infusion at the [facility] center due to; Specific high risk criteria : BMI > 25   Unable to leave a message bc no VM set up. I sent her a mychart message with more info.   Cline Crock PA-C  [facility]

## 2020-09-05 ENCOUNTER — Ambulatory Visit: Payer: BC Managed Care – PPO | Admitting: Physician Assistant

## 2020-09-26 DIAGNOSIS — F5089 Other specified eating disorder: Secondary | ICD-10-CM | POA: Diagnosis not present

## 2020-09-26 DIAGNOSIS — L74512 Primary focal hyperhidrosis, palms: Secondary | ICD-10-CM | POA: Diagnosis not present

## 2020-09-26 DIAGNOSIS — I10 Essential (primary) hypertension: Secondary | ICD-10-CM | POA: Diagnosis not present

## 2020-09-26 DIAGNOSIS — E669 Obesity, unspecified: Secondary | ICD-10-CM | POA: Diagnosis not present

## 2020-09-26 DIAGNOSIS — F419 Anxiety disorder, unspecified: Secondary | ICD-10-CM | POA: Diagnosis not present

## 2020-11-20 ENCOUNTER — Telehealth: Payer: BC Managed Care – PPO | Admitting: Physician Assistant

## 2020-11-20 DIAGNOSIS — J040 Acute laryngitis: Secondary | ICD-10-CM

## 2020-11-20 DIAGNOSIS — J069 Acute upper respiratory infection, unspecified: Secondary | ICD-10-CM

## 2020-11-20 MED ORDER — PREDNISONE 10 MG PO TABS: ORAL_TABLET | ORAL | 0 refills | Status: DC

## 2020-11-20 NOTE — Progress Notes (Signed)
MyChart Video Visit    Virtual Visit via Video Note   This visit type was conducted due to national recommendations for restrictions regarding the COVID-19 Pandemic (e.g. social distancing) in an effort to limit this patient's exposure and mitigate transmission in our community. This patient is at least at moderate risk for complications without adequate follow up. This format is felt to be most appropriate for this patient at this time. Physical exam was limited by quality of the video and audio technology used for the visit.   Patient location: Home Provider location: Office   I discussed the limitations of evaluation and management by telemedicine and the availability of in person appointments. The patient expressed understanding and agreed to proceed.  Patient: Natasha Yang   DOB: 1986-08-16   34 y.o. Female  MRN: [id number] Visit Date: 11/20/2020  Today's healthcare provider: Trey Sailors, PA-C   Chief Complaint  Patient presents with  . Sore Throat   Subjective    Sore Throat  This is a new problem. The current episode started in the past 7 days. The problem has been gradually worsening. There has been no fever. The pain is at a severity of 4/10. The pain is mild. Associated symptoms include congestion, coughing and ear pain (pressure). Pertinent negatives include no headaches, shortness of breath, swollen glands or trouble swallowing. Treatments tried: Mucinex DM. The treatment provided mild relief.    Reports she had URI since 11/14/2020. She reports she has had worsening sinus pressure around her eyes and nose. She tested negative for COVID on 11/16/2020. She is travelling for work. Mucinex DM helps with cough. She is also suffering with some voice loss. She has some public speaking engagements and work meetings coming up shortly.   Medications: Outpatient Medications Prior to Visit  Medication Sig  . clonazePAM (KLONOPIN) 1 MG tablet Take 1 mg by mouth daily as  needed for anxiety. ONLY TAKES WHEN TRAVELING  . Cyanocobalamin (VITAMIN B-12 PO) Take by mouth daily. (Patient not taking: Reported on 08/07/2020)  . lisinopril-hydrochlorothiazide (ZESTORETIC) 20-25 MG tablet  (Patient not taking: Reported on 08/07/2020)   No facility-administered medications prior to visit.    Review of Systems  Constitutional: Negative for chills and fever.  HENT: Positive for congestion, ear pain (pressure), sinus pressure, sore throat and voice change. Negative for postnasal drip, rhinorrhea and trouble swallowing.   Respiratory: Positive for cough. Negative for shortness of breath.   Neurological: Negative for weakness and headaches.      Objective    There were no vitals taken for this visit.   Physical Exam Constitutional:      Appearance: Normal appearance.  Pulmonary:     Effort: Pulmonary effort is normal. No respiratory distress.  Neurological:     Mental Status: She is alert.  Psychiatric:        Mood and Affect: Mood normal.        Behavior: Behavior normal.        Assessment & Plan    1. Viral URI with cough  - predniSONE (DELTASONE) 10 MG tablet; Take 6 pills on day 1, 5 pills on day 2 and so on until complete.  Dispense: 21 tablet; Refill: 0  2. Laryngitis  - predniSONE (DELTASONE) 10 MG tablet; Take 6 pills on day 1, 5 pills on day 2 and so on until complete.  Dispense: 21 tablet; Refill: 0   No follow-ups on file.     I discussed the assessment  and treatment plan with the patient. The patient was provided an opportunity to ask questions and all were answered. The patient agreed with the plan and demonstrated an understanding of the instructions.   The patient was advised to call back or seek an in-person evaluation if the symptoms worsen or if the condition fails to improve as anticipated.   ITrey Sailors, PA-C, have reviewed all documentation for this visit. The documentation on 11/20/20 for the exam, diagnosis,  procedures, and orders are all accurate and complete.  The entirety of the information documented in the History of Present Illness, Review of Systems and Physical Exam were personally obtained by me. Portions of this information were initially documented by [facility] and reviewed by me for thoroughness and accuracy.    Maryella Shivers [facility] [phone] (phone) [phone] (fax)  [facility]

## 2020-11-20 NOTE — Patient Instructions (Signed)

## 2020-11-24 ENCOUNTER — Encounter: Payer: Self-pay | Admitting: Physician Assistant

## 2020-11-24 DIAGNOSIS — J40 Bronchitis, not specified as acute or chronic: Secondary | ICD-10-CM

## 2020-11-24 MED ORDER — PROMETHAZINE-DM 6.25-15 MG/5ML PO SYRP: 5.0000 mL | ORAL_SOLUTION | Freq: Every evening | ORAL | 0 refills | Status: DC | PRN

## 2021-02-17 DIAGNOSIS — Z20822 Contact with and (suspected) exposure to covid-19: Secondary | ICD-10-CM | POA: Diagnosis not present

## 2021-02-17 DIAGNOSIS — Z03818 Encounter for observation for suspected exposure to other biological agents ruled out: Secondary | ICD-10-CM | POA: Diagnosis not present

## 2021-05-11 DIAGNOSIS — R300 Dysuria: Secondary | ICD-10-CM | POA: Diagnosis not present

## 2021-07-02 DIAGNOSIS — E6609 Other obesity due to excess calories: Secondary | ICD-10-CM | POA: Diagnosis not present

## 2021-07-02 DIAGNOSIS — F064 Anxiety disorder due to known physiological condition: Secondary | ICD-10-CM | POA: Diagnosis not present

## 2021-07-02 DIAGNOSIS — N946 Dysmenorrhea, unspecified: Secondary | ICD-10-CM | POA: Diagnosis not present

## 2021-07-02 DIAGNOSIS — Z6838 Body mass index (BMI) 38.0-38.9, adult: Secondary | ICD-10-CM | POA: Diagnosis not present

## 2021-07-02 DIAGNOSIS — G43919 Migraine, unspecified, intractable, without status migrainosus: Secondary | ICD-10-CM | POA: Diagnosis not present

## 2021-07-02 DIAGNOSIS — Z7189 Other specified counseling: Secondary | ICD-10-CM | POA: Diagnosis not present

## 2021-07-02 DIAGNOSIS — Z0001 Encounter for general adult medical examination with abnormal findings: Secondary | ICD-10-CM | POA: Diagnosis not present

## 2021-07-17 DIAGNOSIS — E7800 Pure hypercholesterolemia, unspecified: Secondary | ICD-10-CM | POA: Diagnosis not present

## 2021-07-17 DIAGNOSIS — G43919 Migraine, unspecified, intractable, without status migrainosus: Secondary | ICD-10-CM | POA: Diagnosis not present

## 2021-07-17 DIAGNOSIS — Z6839 Body mass index (BMI) 39.0-39.9, adult: Secondary | ICD-10-CM | POA: Diagnosis not present

## 2021-07-17 DIAGNOSIS — E6609 Other obesity due to excess calories: Secondary | ICD-10-CM | POA: Diagnosis not present

## 2021-07-17 DIAGNOSIS — Z7189 Other specified counseling: Secondary | ICD-10-CM | POA: Diagnosis not present

## 2021-07-17 DIAGNOSIS — N946 Dysmenorrhea, unspecified: Secondary | ICD-10-CM | POA: Diagnosis not present

## 2021-07-17 DIAGNOSIS — F909 Attention-deficit hyperactivity disorder, unspecified type: Secondary | ICD-10-CM | POA: Diagnosis not present

## 2022-02-16 ENCOUNTER — Ambulatory Visit: Payer: BC Managed Care – PPO | Admitting: Podiatry

## 2022-02-16 ENCOUNTER — Ambulatory Visit: Payer: BC Managed Care – PPO

## 2022-02-16 ENCOUNTER — Other Ambulatory Visit: Payer: Self-pay

## 2022-02-16 DIAGNOSIS — M7671 Peroneal tendinitis, right leg: Secondary | ICD-10-CM

## 2022-02-16 DIAGNOSIS — M79671 Pain in right foot: Secondary | ICD-10-CM

## 2022-02-19 ENCOUNTER — Encounter: Payer: Self-pay | Admitting: Podiatry

## 2022-02-19 NOTE — Progress Notes (Signed)
°  Subjective:  Patient ID: Natasha Yang, female    DOB: 1986-06-30,  MRN: [id number]  No chief complaint on file.   36 y.o. female presents with the above complaint.  Patient presents with complaint of right lateral foot pain.  Patient states is painful to touch.  Patient states is gotten worse.  She walks about 2 miles a day in the morning.  Hurts on the outside of the foot hard to apply pressure.  She wanted get it evaluated.  She denies any other acute complaint nothing has helped.  Pain scale 7 out of 10 hurts with ambulation   Review of Systems: Negative except as noted in the HPI. Denies N/V/F/Ch.  Past Medical History:  Diagnosis Date   Anxiety    Asthma    AS A CHILD -NO INHALERS   Chickenpox    Difficulty concentrating    Hyperhydrosis disorder    Thrombocytopenia (HCC)    DURING PREGNANCY IN 2015-RESOLVED AFTER DELIVERY    Current Outpatient Medications:    clonazePAM (KLONOPIN) 1 MG tablet, Take 1 mg by mouth daily as needed for anxiety. ONLY TAKES WHEN TRAVELING, Disp: , Rfl:    Cyanocobalamin (VITAMIN B-12 PO), Take by mouth daily. (Patient not taking: Reported on 08/07/2020), Disp: , Rfl:    predniSONE (DELTASONE) 10 MG tablet, Take 6 pills on day 1, 5 pills on day 2 and so on until complete., Disp: 21 tablet, Rfl: 0   promethazine-dextromethorphan (PROMETHAZINE-DM) 6.25-15 MG/5ML syrup, Take 5 mLs by mouth at bedtime as needed., Disp: 118 mL, Rfl: 0  Social History   Tobacco Use  Smoking Status Never  Smokeless Tobacco Never    No Known Allergies Objective:  There were no vitals filed for this visit. There is no height or weight on file to calculate BMI. Constitutional Well developed. Well nourished.  Vascular Dorsalis pedis pulses palpable bilaterally. Posterior tibial pulses palpable bilaterally. Capillary refill normal to all digits.  No cyanosis or clubbing noted. Pedal hair growth normal.  Neurologic Normal speech. Oriented to person, place, and  time. Epicritic sensation to light touch grossly present bilaterally.  Dermatologic Nails well groomed and normal in appearance. No open wounds. No skin lesions.  Orthopedic: Pain on palpation along the course of the peroneal tendon no pain at the insertion.  Pain with dorsiflexion eversion of the foot.  No with plantarflexion inversion of the foot.  No pain at the ATFL ligament, Achilles tendon, posterior tibial tendon.   Radiographs: 3 views of skeletally mature adult right foot:No osteoarthritic changes noted.  Mild bunion deformity noted.  Midfoot arthritis noted mild.  Mild elevatus noted.  No other bony abnormalities identified Assessment:   1. Peroneal tendinitis, right    Plan:  Patient was evaluated and treated and all questions answered.  Right peroneal tendinitis -All questions and concerns were discussed with the patient in extensive detail -Given the amount of pain that she is having clinically I believe she will benefit from cam boot immobilization to allow the soft tissue structure to heal appropriately.  Patient agrees with plan would like to proceed with proceed with immobilization.  If she still continues to have pain but has improved we will discuss steroid injection during next medical visit.  She states understanding -Cam boot was dispensed No follow-ups on file.

## 2022-03-19 ENCOUNTER — Ambulatory Visit: Payer: BC Managed Care – PPO | Admitting: Podiatry

## 2022-03-19 DIAGNOSIS — M7671 Peroneal tendinitis, right leg: Secondary | ICD-10-CM | POA: Diagnosis not present

## 2022-03-19 MED ORDER — MELOXICAM 15 MG PO TABS: 15.0000 mg | ORAL_TABLET | Freq: Every day | ORAL | 1 refills | Status: DC

## 2022-03-19 NOTE — Progress Notes (Signed)
? ?  HPI: 36 y.o. female presenting today for follow-up evaluation of peroneal tendinitis to the right foot.  Patient has been wearing a cam boot for the last 4 weeks.  She was last seen on 02/16/2022 with Dr. Allena Katz.  She presents for further treatment and evaluation ? ?Past Medical History:  ?Diagnosis Date  ? Anxiety   ? Asthma   ? AS A CHILD -NO INHALERS  ? Chickenpox   ? Difficulty concentrating   ? Hyperhydrosis disorder   ? Thrombocytopenia (HCC)   ? DURING PREGNANCY IN 2015-RESOLVED AFTER DELIVERY  ? ? ?Past Surgical History:  ?Procedure Laterality Date  ? CESAREAN SECTION  2015  ? 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 Procedure: EXCISION OF ABDOMINAL WALL ENDOMETRIUM;  Surgeon: Earline Mayotte, MD;  Location: [facility] ORS;  Service: General;  Laterality: Right;  ? ? ?No Known Allergies ?  ?Physical Exam: ?General: The patient is alert and oriented x3 in no acute distress. ? ?Dermatology: Skin is warm, dry and supple bilateral lower extremities. Negative for open lesions or macerations. ? ?Vascular: Palpable pedal pulses bilaterally. Capillary refill within normal limits.  Negative for any significant edema or erythema ? ?Neurological: Light touch and protective threshold grossly intact ? ?Musculoskeletal Exam: No pedal deformities noted.  There is some slight tenderness to palpation at the fifth metatarsal tubercle at the insertion of the peroneal tendon right.  Also limited ankle joint dorsiflexion secondary to a tight posterior complex.  Positive Silfverskiold test ? ?Assessment: ?1.  Insertional peroneal tendinitis right ?2.  Gastroc equinus right ? ? ?Plan of Care:  ?1. Patient evaluated. X-Rays reviewed.  ?2.  Recommend daily stretching exercises to address the equinus and tight posterior complex of the right lower extremity ?3.  Patient can discontinue cam boot.  Recommend good supportive shoes ?4.  Prescription for meloxicam 15 mg daily as needed ?5.  Return to clinic as needed ? ?*Works  from home ?  ?Felecia Shelling, DPM ?[facility] ? ?Dr. Felecia Shelling, DPM  ?  ?[address]                                        ?[place], [place] [address]                ?Office [phone]  ?Fax [phone] ? ? ? ? ?

## 2022-06-29 ENCOUNTER — Other Ambulatory Visit: Payer: Self-pay | Admitting: Orthopedic Surgery

## 2022-06-29 DIAGNOSIS — M25511 Pain in right shoulder: Secondary | ICD-10-CM

## 2022-07-05 ENCOUNTER — Ambulatory Visit
Admission: RE | Admit: 2022-07-05 | Discharge: 2022-07-05 | Disposition: A | Discharge status: Home / Self Care | Payer: BC Managed Care – PPO | Source: Ambulatory Visit | Attending: Orthopedic Surgery | Admitting: Orthopedic Surgery

## 2022-07-05 DIAGNOSIS — M25511 Pain in right shoulder: Secondary | ICD-10-CM

## 2022-08-26 DIAGNOSIS — T753XXA Motion sickness, initial encounter: Secondary | ICD-10-CM | POA: Diagnosis not present

## 2022-08-26 DIAGNOSIS — Z6839 Body mass index (BMI) 39.0-39.9, adult: Secondary | ICD-10-CM | POA: Diagnosis not present

## 2022-08-26 DIAGNOSIS — E668 Other obesity: Secondary | ICD-10-CM | POA: Diagnosis not present

## 2022-08-26 DIAGNOSIS — F909 Attention-deficit hyperactivity disorder, unspecified type: Secondary | ICD-10-CM | POA: Diagnosis not present

## 2022-10-20 DIAGNOSIS — R600 Localized edema: Secondary | ICD-10-CM | POA: Diagnosis not present

## 2022-10-20 DIAGNOSIS — R61 Generalized hyperhidrosis: Secondary | ICD-10-CM | POA: Diagnosis not present

## 2022-10-20 DIAGNOSIS — E7800 Pure hypercholesterolemia, unspecified: Secondary | ICD-10-CM | POA: Diagnosis not present

## 2022-10-20 DIAGNOSIS — E6609 Other obesity due to excess calories: Secondary | ICD-10-CM | POA: Diagnosis not present

## 2022-10-20 DIAGNOSIS — F064 Anxiety disorder due to known physiological condition: Secondary | ICD-10-CM | POA: Diagnosis not present

## 2022-10-27 DIAGNOSIS — E6609 Other obesity due to excess calories: Secondary | ICD-10-CM | POA: Diagnosis not present

## 2022-10-27 DIAGNOSIS — E7800 Pure hypercholesterolemia, unspecified: Secondary | ICD-10-CM | POA: Diagnosis not present

## 2022-10-27 DIAGNOSIS — N946 Dysmenorrhea, unspecified: Secondary | ICD-10-CM | POA: Diagnosis not present

## 2022-10-27 DIAGNOSIS — Z6839 Body mass index (BMI) 39.0-39.9, adult: Secondary | ICD-10-CM | POA: Diagnosis not present

## 2022-10-27 DIAGNOSIS — N3000 Acute cystitis without hematuria: Secondary | ICD-10-CM | POA: Diagnosis not present

## 2022-10-27 DIAGNOSIS — N3090 Cystitis, unspecified without hematuria: Secondary | ICD-10-CM | POA: Diagnosis not present

## 2022-11-17 DIAGNOSIS — M67911 Unspecified disorder of synovium and tendon, right shoulder: Secondary | ICD-10-CM | POA: Diagnosis not present

## 2022-12-08 DIAGNOSIS — B349 Viral infection, unspecified: Secondary | ICD-10-CM | POA: Diagnosis not present

## 2022-12-08 DIAGNOSIS — J028 Acute pharyngitis due to other specified organisms: Secondary | ICD-10-CM | POA: Diagnosis not present

## 2022-12-08 DIAGNOSIS — J209 Acute bronchitis, unspecified: Secondary | ICD-10-CM | POA: Diagnosis not present

## 2022-12-08 DIAGNOSIS — J029 Acute pharyngitis, unspecified: Secondary | ICD-10-CM | POA: Diagnosis not present

## 2023-01-04 DIAGNOSIS — G4700 Insomnia, unspecified: Secondary | ICD-10-CM | POA: Diagnosis not present

## 2023-01-04 DIAGNOSIS — F5081 Binge eating disorder: Secondary | ICD-10-CM | POA: Diagnosis not present

## 2023-01-04 DIAGNOSIS — F909 Attention-deficit hyperactivity disorder, unspecified type: Secondary | ICD-10-CM | POA: Diagnosis not present

## 2023-01-04 DIAGNOSIS — E669 Obesity, unspecified: Secondary | ICD-10-CM | POA: Diagnosis not present

## 2023-01-21 DIAGNOSIS — E6609 Other obesity due to excess calories: Secondary | ICD-10-CM | POA: Diagnosis not present

## 2023-01-21 DIAGNOSIS — R600 Localized edema: Secondary | ICD-10-CM | POA: Diagnosis not present

## 2023-01-21 DIAGNOSIS — F064 Anxiety disorder due to known physiological condition: Secondary | ICD-10-CM | POA: Diagnosis not present

## 2023-01-21 DIAGNOSIS — E7800 Pure hypercholesterolemia, unspecified: Secondary | ICD-10-CM | POA: Diagnosis not present

## 2023-01-21 LAB — HEPATIC FUNCTION PANEL
ALT: 15 U/L (ref 7–35)
AST: 16 (ref 13–35)
Alkaline Phosphatase: 67 (ref 25–125)
Bilirubin, Total: 0.6

## 2023-01-21 LAB — COMPREHENSIVE METABOLIC PANEL WITH GFR
Albumin: 4.6 (ref 3.5–5.0)
Calcium: 9.2 (ref 8.7–10.7)
Globulin: 1.9
eGFR: 104

## 2023-01-21 LAB — CBC AND DIFFERENTIAL
A1c: 5
EGFR (Non-African Amer.): 104
HCT: 41 (ref 36–46)
Hemoglobin: 13.7 (ref 12.0–16.0)
Platelets: 155 K/uL (ref 150–400)
TSH: 1.8
WBC: 5.6

## 2023-01-21 LAB — BASIC METABOLIC PANEL WITH GFR
BUN: 10 (ref 4–21)
CO2: 24 — AB (ref 13–22)
Chloride: 104 (ref 99–108)
Creatinine: 0.8 (ref 0.5–1.1)
Glucose: 95
Potassium: 4.4 meq/L (ref 3.5–5.1)
Sodium: 141 (ref 137–147)

## 2023-01-21 LAB — LIPID PANEL
Cholesterol: 181 (ref 0–200)
HDL: 56 (ref 35–70)
LDL Cholesterol: 106
LDl/HDL Ratio: 1.9
Triglycerides: 108 (ref 40–160)

## 2023-01-21 LAB — HEMOGLOBIN A1C: Hemoglobin A1C: 5

## 2023-01-21 LAB — CBC: RBC: 4.79 (ref 3.87–5.11)

## 2023-01-21 LAB — TSH: TSH: 1.8 (ref 0.41–5.90)

## 2023-02-01 DIAGNOSIS — E6609 Other obesity due to excess calories: Secondary | ICD-10-CM | POA: Diagnosis not present

## 2023-02-01 DIAGNOSIS — Z6839 Body mass index (BMI) 39.0-39.9, adult: Secondary | ICD-10-CM | POA: Diagnosis not present

## 2023-02-01 DIAGNOSIS — E7800 Pure hypercholesterolemia, unspecified: Secondary | ICD-10-CM | POA: Diagnosis not present

## 2023-02-01 DIAGNOSIS — R600 Localized edema: Secondary | ICD-10-CM | POA: Diagnosis not present

## 2023-02-01 DIAGNOSIS — R61 Generalized hyperhidrosis: Secondary | ICD-10-CM | POA: Diagnosis not present

## 2023-02-08 DIAGNOSIS — F909 Attention-deficit hyperactivity disorder, unspecified type: Secondary | ICD-10-CM | POA: Diagnosis not present

## 2023-02-08 DIAGNOSIS — G4700 Insomnia, unspecified: Secondary | ICD-10-CM | POA: Diagnosis not present

## 2023-02-08 DIAGNOSIS — F5081 Binge eating disorder: Secondary | ICD-10-CM | POA: Diagnosis not present

## 2023-02-08 DIAGNOSIS — E669 Obesity, unspecified: Secondary | ICD-10-CM | POA: Diagnosis not present

## 2023-03-14 DIAGNOSIS — F909 Attention-deficit hyperactivity disorder, unspecified type: Secondary | ICD-10-CM | POA: Diagnosis not present

## 2023-03-14 DIAGNOSIS — E669 Obesity, unspecified: Secondary | ICD-10-CM | POA: Diagnosis not present

## 2023-03-14 DIAGNOSIS — G4700 Insomnia, unspecified: Secondary | ICD-10-CM | POA: Diagnosis not present

## 2023-03-14 DIAGNOSIS — F5081 Binge eating disorder: Secondary | ICD-10-CM | POA: Diagnosis not present

## 2023-04-26 DIAGNOSIS — E7800 Pure hypercholesterolemia, unspecified: Secondary | ICD-10-CM | POA: Diagnosis not present

## 2023-04-26 DIAGNOSIS — R600 Localized edema: Secondary | ICD-10-CM | POA: Diagnosis not present

## 2023-04-26 DIAGNOSIS — R61 Generalized hyperhidrosis: Secondary | ICD-10-CM | POA: Diagnosis not present

## 2023-04-26 DIAGNOSIS — F064 Anxiety disorder due to known physiological condition: Secondary | ICD-10-CM | POA: Diagnosis not present

## 2023-05-03 DIAGNOSIS — G43919 Migraine, unspecified, intractable, without status migrainosus: Secondary | ICD-10-CM | POA: Diagnosis not present

## 2023-05-03 DIAGNOSIS — F909 Attention-deficit hyperactivity disorder, unspecified type: Secondary | ICD-10-CM | POA: Diagnosis not present

## 2023-05-03 DIAGNOSIS — E7800 Pure hypercholesterolemia, unspecified: Secondary | ICD-10-CM | POA: Diagnosis not present

## 2023-05-03 DIAGNOSIS — E6609 Other obesity due to excess calories: Secondary | ICD-10-CM | POA: Diagnosis not present

## 2023-05-03 DIAGNOSIS — Z6836 Body mass index (BMI) 36.0-36.9, adult: Secondary | ICD-10-CM | POA: Diagnosis not present

## 2023-06-06 DIAGNOSIS — F419 Anxiety disorder, unspecified: Secondary | ICD-10-CM | POA: Diagnosis not present

## 2023-06-06 DIAGNOSIS — G4700 Insomnia, unspecified: Secondary | ICD-10-CM | POA: Diagnosis not present

## 2023-06-06 DIAGNOSIS — F909 Attention-deficit hyperactivity disorder, unspecified type: Secondary | ICD-10-CM | POA: Diagnosis not present

## 2023-06-06 DIAGNOSIS — E669 Obesity, unspecified: Secondary | ICD-10-CM | POA: Diagnosis not present

## 2023-06-15 DIAGNOSIS — Z1283 Encounter for screening for malignant neoplasm of skin: Secondary | ICD-10-CM | POA: Diagnosis not present

## 2023-06-15 DIAGNOSIS — L814 Other melanin hyperpigmentation: Secondary | ICD-10-CM | POA: Diagnosis not present

## 2023-06-15 DIAGNOSIS — L738 Other specified follicular disorders: Secondary | ICD-10-CM | POA: Diagnosis not present

## 2023-06-15 DIAGNOSIS — D229 Melanocytic nevi, unspecified: Secondary | ICD-10-CM | POA: Diagnosis not present

## 2023-08-09 DIAGNOSIS — F419 Anxiety disorder, unspecified: Secondary | ICD-10-CM | POA: Diagnosis not present

## 2023-08-09 DIAGNOSIS — E669 Obesity, unspecified: Secondary | ICD-10-CM | POA: Diagnosis not present

## 2023-08-09 DIAGNOSIS — G4700 Insomnia, unspecified: Secondary | ICD-10-CM | POA: Diagnosis not present

## 2023-08-09 DIAGNOSIS — F909 Attention-deficit hyperactivity disorder, unspecified type: Secondary | ICD-10-CM | POA: Diagnosis not present

## 2023-08-29 DIAGNOSIS — J019 Acute sinusitis, unspecified: Secondary | ICD-10-CM | POA: Diagnosis not present

## 2023-08-29 DIAGNOSIS — E669 Obesity, unspecified: Secondary | ICD-10-CM | POA: Diagnosis not present

## 2023-08-29 DIAGNOSIS — F909 Attention-deficit hyperactivity disorder, unspecified type: Secondary | ICD-10-CM | POA: Diagnosis not present

## 2023-08-29 DIAGNOSIS — F419 Anxiety disorder, unspecified: Secondary | ICD-10-CM | POA: Diagnosis not present

## 2023-10-13 LAB — RESULTS CONSOLE HPV: CHL HPV: NEGATIVE

## 2023-10-13 LAB — HM PAP SMEAR

## 2023-10-19 DIAGNOSIS — Z01411 Encounter for gynecological examination (general) (routine) with abnormal findings: Secondary | ICD-10-CM | POA: Diagnosis not present

## 2023-10-19 DIAGNOSIS — N941 Unspecified dyspareunia: Secondary | ICD-10-CM | POA: Diagnosis not present

## 2023-10-19 DIAGNOSIS — Z124 Encounter for screening for malignant neoplasm of cervix: Secondary | ICD-10-CM | POA: Diagnosis not present

## 2023-10-19 DIAGNOSIS — Z113 Encounter for screening for infections with a predominantly sexual mode of transmission: Secondary | ICD-10-CM | POA: Diagnosis not present

## 2023-10-19 DIAGNOSIS — N898 Other specified noninflammatory disorders of vagina: Secondary | ICD-10-CM | POA: Diagnosis not present

## 2023-11-22 DIAGNOSIS — E669 Obesity, unspecified: Secondary | ICD-10-CM | POA: Diagnosis not present

## 2023-11-22 DIAGNOSIS — F419 Anxiety disorder, unspecified: Secondary | ICD-10-CM | POA: Diagnosis not present

## 2023-11-22 DIAGNOSIS — F909 Attention-deficit hyperactivity disorder, unspecified type: Secondary | ICD-10-CM | POA: Diagnosis not present

## 2023-12-09 ENCOUNTER — Telehealth: Payer: BC Managed Care – PPO | Admitting: Physician Assistant

## 2023-12-09 DIAGNOSIS — J019 Acute sinusitis, unspecified: Secondary | ICD-10-CM | POA: Diagnosis not present

## 2023-12-09 DIAGNOSIS — B9689 Other specified bacterial agents as the cause of diseases classified elsewhere: Secondary | ICD-10-CM

## 2023-12-09 MED ORDER — AMOXICILLIN-POT CLAVULANATE 875-125 MG PO TABS: 1.0000 | ORAL_TABLET | Freq: Two times a day (BID) | ORAL | 0 refills | Status: DC

## 2023-12-09 NOTE — Progress Notes (Signed)

## 2023-12-22 ENCOUNTER — Telehealth: Payer: BC Managed Care – PPO | Admitting: Family Medicine

## 2023-12-22 DIAGNOSIS — R3989 Other symptoms and signs involving the genitourinary system: Secondary | ICD-10-CM

## 2023-12-22 MED ORDER — CEPHALEXIN 500 MG PO CAPS: 500.0000 mg | ORAL_CAPSULE | Freq: Two times a day (BID) | ORAL | 0 refills | Status: AC

## 2023-12-22 NOTE — Progress Notes (Signed)

## 2024-01-13 DIAGNOSIS — N898 Other specified noninflammatory disorders of vagina: Secondary | ICD-10-CM | POA: Diagnosis not present

## 2024-01-13 DIAGNOSIS — R399 Unspecified symptoms and signs involving the genitourinary system: Secondary | ICD-10-CM | POA: Diagnosis not present

## 2024-01-13 DIAGNOSIS — B9689 Other specified bacterial agents as the cause of diseases classified elsewhere: Secondary | ICD-10-CM | POA: Diagnosis not present

## 2024-01-13 DIAGNOSIS — N760 Acute vaginitis: Secondary | ICD-10-CM | POA: Diagnosis not present

## 2024-01-13 DIAGNOSIS — B3731 Acute candidiasis of vulva and vagina: Secondary | ICD-10-CM | POA: Diagnosis not present

## 2024-01-23 ENCOUNTER — Ambulatory Visit: Payer: Self-pay | Admitting: Family Medicine

## 2024-02-22 ENCOUNTER — Ambulatory Visit (INDEPENDENT_AMBULATORY_CARE_PROVIDER_SITE_OTHER): Payer: BC Managed Care – PPO | Admitting: Family Medicine

## 2024-02-22 ENCOUNTER — Encounter: Payer: Self-pay | Admitting: Family Medicine

## 2024-02-22 VITALS — BP 121/81 | HR 72 | Ht 66.0 in | Wt 199.0 lb

## 2024-02-22 DIAGNOSIS — E66811 Obesity, class 1: Secondary | ICD-10-CM | POA: Diagnosis not present

## 2024-02-22 DIAGNOSIS — Z0000 Encounter for general adult medical examination without abnormal findings: Secondary | ICD-10-CM | POA: Diagnosis not present

## 2024-02-22 DIAGNOSIS — Z130 Encounter for screening for diseases of the blood and blood-forming organs and certain disorders involving the immune mechanism: Secondary | ICD-10-CM | POA: Diagnosis not present

## 2024-02-22 DIAGNOSIS — E559 Vitamin D deficiency, unspecified: Secondary | ICD-10-CM

## 2024-02-22 DIAGNOSIS — Z1159 Encounter for screening for other viral diseases: Secondary | ICD-10-CM | POA: Diagnosis not present

## 2024-02-22 DIAGNOSIS — Z114 Encounter for screening for human immunodeficiency virus [HIV]: Secondary | ICD-10-CM

## 2024-02-22 DIAGNOSIS — R5382 Chronic fatigue, unspecified: Secondary | ICD-10-CM | POA: Diagnosis not present

## 2024-02-22 DIAGNOSIS — F988 Other specified behavioral and emotional disorders with onset usually occurring in childhood and adolescence: Secondary | ICD-10-CM

## 2024-02-22 DIAGNOSIS — E538 Deficiency of other specified B group vitamins: Secondary | ICD-10-CM | POA: Diagnosis not present

## 2024-02-22 DIAGNOSIS — Z13228 Encounter for screening for other metabolic disorders: Secondary | ICD-10-CM | POA: Diagnosis not present

## 2024-02-22 DIAGNOSIS — F418 Other specified anxiety disorders: Secondary | ICD-10-CM

## 2024-02-22 DIAGNOSIS — Z7689 Persons encountering health services in other specified circumstances: Secondary | ICD-10-CM | POA: Insufficient documentation

## 2024-02-22 DIAGNOSIS — Z1329 Encounter for screening for other suspected endocrine disorder: Secondary | ICD-10-CM | POA: Diagnosis not present

## 2024-02-22 DIAGNOSIS — Z13220 Encounter for screening for lipoid disorders: Secondary | ICD-10-CM

## 2024-02-22 DIAGNOSIS — R61 Generalized hyperhidrosis: Secondary | ICD-10-CM

## 2024-02-22 NOTE — Assessment & Plan Note (Addendum)
 Physical exam overall unremarkable except as noted above. Routine lab work ordered as noted. Establishing care and transferring general medical needs and prescriptions to this practice. Discussed the importance of routine screenings and vaccinations. Prefers to continue dermatological care in [place] due to difficulty getting local appointments. - Order blood work including thyroid level, vitamin D, B12, and anemia screening - Screen for hepatitis C and HIV - Document Pap smear results from October 2024 as negative for HPV - Loraine Leriche flu shot as declined for this season - Document no COVID vaccination

## 2024-02-22 NOTE — Progress Notes (Signed)
 New patient visit   Patient: Natasha Yang   DOB: 12/03/1986   38 y.o. Female  MRN: [id number] Visit Date: 02/22/2024  Today's healthcare provider: Sherlyn Hay, DO   Chief Complaint  Patient presents with   New office Visit    No concerns just need to re eablish care.   Subjective    Natasha Yang is a 38 y.o. female who presents today as a new patient to establish care.  HPI HPI     New office Visit    Additional comments: No concerns just need to re eablish care.      Last edited by Liz Beach, CMA on 02/22/2024  1:45 PM.     Natasha Yang is a 38 year old female who presents to establish care and transfer general medical needs.  She previously received care in [facility] for dermatological needs and general care after her previous provider retired. The dermatologist she was seeing has left the practice, and she finds it difficult to get dermatology appointments locally, so she plans to continue traveling for dermatology while transferring her general care here.  She experiences persistent fatigue, feeling 'very tired all the time' regardless of sleep duration, which she considers normal. She exercises regularly from Monday to Friday and is actively working on weight loss. She describes herself as 'not a bad eater' but is trying to be more conscious of her eating habits. No history of anemia, but she will be screened for it due to her fatigue. She has a history of vitamin B12 and D deficiency, which will be checked again.   She experiences dizziness when standing up after bending down or squatting, which she notices more as she has gotten older.  She mentions anxiety, which worsens when she does not take her medication. She takes clonazepam as needed for travel or presentations. She reports irritability when overwhelmed, impacting her work and family life.  She is currently taking Adderall 20 mg extended release for ADHD. She reports that her anxiety  worsens when she does not take her medication.  She has a history of hyperhidrosis, experiencing swelling in her hands during hotter months, though it has not been a significant problem recently.  She has been receiving dermatological care in [place] and plans to continue with her current provider for dermatology needs. She has an upcoming appointment for an annual skin review.    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   Procedure: EXCISION OF ABDOMINAL WALL ENDOMETRIUM;  Surgeon: Earline Mayotte, MD;  Location: [facility] ORS;  Service: General;  Laterality: Right;   Family Status  Relation Name Status   Mother  Alive   Father  Alive   Sister  Alive   Brother  Alive   Oceanographer  (Not Specified)   Neg Hx  (Not Specified)  No partnership data on file   Family History  Problem Relation Age of Onset  Hypertension Mother    Hypertension Father    Melanoma Father    Healthy Sister    Healthy Brother    Breast cancer Paternal Aunt    Colon cancer Neg Hx    Social History   Socioeconomic History   Marital status: Married    Spouse name: Not on file   Number of children: Not on file   Years of education: Not on file   Highest education level: Bachelor's degree (e.g., BA, AB, BS)  Occupational History   Not on file  Tobacco Use   Smoking status: Never   Smokeless tobacco: Never  Vaping Use   Vaping status: Never Used  Substance and Sexual Activity   Alcohol use: Yes     Alcohol/week: 0.0 standard drinks of alcohol    Comment: social   Drug use: No   Sexual activity: Yes    Partners: Male    Birth control/protection: I.U.D.  Other Topics Concern   Not on file  Social History Narrative   Married.   1 child.   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   Currently Landscape architect for Social Work.    Enjoys spending time with family.   Social Drivers of Corporate investment banker Strain: Low Risk  (02/21/2024)   Overall Financial Resource Strain (CARDIA)    Difficulty of Paying Living Expenses: Not hard at all  Food Insecurity: No Food Insecurity (02/21/2024)   Hunger Vital Sign    Worried About Running Out of Food in the Last Year: Never true    Ran Out of Food in the Last Year: Never true  Transportation Needs: No Transportation Needs (02/21/2024)   PRAPARE - Administrator, Civil Service (Medical): No    Lack of Transportation (Non-Medical): No  Physical Activity: Sufficiently Active (02/21/2024)   Exercise Vital Sign    Days of Exercise per Week: 5 days    Minutes of Exercise per Session: 60 min  Stress: No Stress Concern Present (02/21/2024)   [employer] of Occupational Health - Occupational Stress Questionnaire    Feeling of Stress : Only a little  Social Connections: Socially Integrated (02/21/2024)   Social Connection and Isolation Panel [NHANES]    Frequency of Communication with Friends and Family: Three times a week    Frequency of Social Gatherings with Friends and Family: Once a week    Attends Religious Services: More than 4 times per year    Active Member of [employer] or Organizations: Yes    Attends Banker Meetings: 1 to 4 times per year    Marital Status: Married   Outpatient Medications Prior to Visit  Medication Sig   levonorgestrel (LILETTA) 20.1 MCG/DAY IUD IUD 1 each by Intrauterine route once.   amphetamine-dextroamphetamine (ADDERALL XR) 20 MG 24 hr capsule Take 20 mg by mouth every morning.    clonazePAM (KLONOPIN) 1 MG tablet Take 1 mg by mouth daily as needed for anxiety. ONLY TAKES WHEN TRAVELING   No facility-administered medications prior to visit.   No Known Allergies  Immunization History  Administered Date(s) Administered   Influenza,inj,Quad PF,6+ Mos 11/23/2016, 09/27/2017   Tdap 11/23/2016    Health Maintenance  Topic Date Due   HIV Screening  Never done   Hepatitis C Screening  Never done   INFLUENZA VACCINE  03/19/2024 (Originally 07/21/2023)   COVID-19 Vaccine (1 - 2024-25 season) 09/19/2024 (Originally 08/21/2023)   DTaP/Tdap/Td (2 - Td or Tdap) 11/23/2026   Cervical  Cancer Screening (HPV/Pap Cotest)  10/12/2028   HPV VACCINES  Aged Out    Patient Care Team: Takeisha Cianci, Monico Blitz, DO as PCP - General (Family Medicine) Maryella Shivers as Physician Assistant (Physician Assistant) Earline Mayotte, MD (General Surgery)  Review of Systems  Constitutional:  Negative for chills, fatigue and fever.  HENT:  Negative for congestion, ear pain, rhinorrhea, sneezing and sore throat.   Eyes: Negative.  Negative for pain and redness.  Respiratory:  Negative for cough, shortness of breath and wheezing.   Cardiovascular:  Negative for chest pain and leg swelling.  Gastrointestinal:  Negative for abdominal pain, blood in stool, constipation, diarrhea and nausea.  Endocrine: Negative for polydipsia and polyphagia.  Genitourinary: Negative.  Negative for dysuria, flank pain, hematuria, pelvic pain, vaginal bleeding and vaginal discharge.  Musculoskeletal:  Negative for arthralgias, back pain, gait problem and joint swelling.  Skin:  Negative for rash.  Neurological: Negative.  Negative for dizziness, tremors, seizures, weakness, light-headedness, numbness and headaches.  Hematological:  Negative for adenopathy.  Psychiatric/Behavioral: Negative.  Negative for behavioral problems, confusion and dysphoric mood. The patient is not nervous/anxious and is not hyperactive.          Objective    BP 121/81 (BP Location: Left Arm, Patient Position: Sitting, Cuff Size: Normal)   Pulse 72   Ht 5\' 6"  (1.676 m)   Wt 199 lb (90.3 kg)   SpO2 100%   BMI 32.12 kg/m     Physical Exam Vitals and nursing note reviewed.  Constitutional:      General: She is awake.     Appearance: Normal appearance.  HENT:     Head: Normocephalic and atraumatic.     Right Ear: Tympanic membrane, ear canal and external ear normal.     Left Ear: Tympanic membrane, ear canal and external ear normal.     Nose: Nose normal.     Mouth/Throat:     Mouth: Mucous membranes are moist.     Pharynx: Oropharynx is clear. No oropharyngeal exudate or posterior oropharyngeal erythema.  Eyes:     General: No scleral icterus.    Extraocular Movements: Extraocular movements intact.     Conjunctiva/sclera: Conjunctivae normal.     Pupils: Pupils are equal, round, and reactive to light.  Neck:     Thyroid: No thyromegaly or thyroid tenderness.  Cardiovascular:     Rate and Rhythm: Normal rate and regular rhythm.     Pulses: Normal pulses.     Heart sounds: Normal heart sounds.  Pulmonary:     Effort: Pulmonary effort is normal. No tachypnea, bradypnea or respiratory distress.     Breath sounds: Normal breath sounds. No stridor. No wheezing, rhonchi or rales.  Abdominal:     General: Bowel sounds are normal. There is no distension.     Palpations: Abdomen is soft. There is no mass.     Tenderness: There is no abdominal tenderness. There is no guarding.     Hernia: No hernia is present.  Musculoskeletal:     Cervical back: Normal range of motion and neck supple.     Right lower leg: No edema.     Left lower leg: No edema.  Lymphadenopathy:     Cervical: No cervical adenopathy.  Skin:    General: Skin is warm and dry.  Neurological:     Mental Status: She is alert and oriented to person, place, and time. Mental status is at baseline.  Psychiatric:  Mood and Affect: Mood  normal.        Behavior: Behavior normal.     Depression Screen    02/22/2024    2:12 PM 09/27/2017   10:58 AM  PHQ 2/9 Scores  PHQ - 2 Score 0 0  PHQ- 9 Score 5 6   Results for orders placed or performed in visit on 02/22/24  HM PAP SMEAR  Result Value Ref Range   HM Pap smear NILM   Results Console HPV  Result Value Ref Range   CHL HPV Negative     Assessment & Plan     Encounter for medical examination to establish care Assessment & Plan: Physical exam overall unremarkable except as noted above. Routine lab work ordered as noted. Establishing care and transferring general medical needs and prescriptions to this practice. Discussed the importance of routine screenings and vaccinations. Prefers to continue dermatological care in [place] due to difficulty getting local appointments. - Order blood work including thyroid level, vitamin D, B12, and anemia screening - Screen for hepatitis C and HIV - Document Pap smear results from October 2024 as negative for HPV - Loraine Leriche flu shot as declined for this season - Document no COVID vaccination   Chronic fatigue Assessment & Plan: Reports chronic fatigue despite adequate sleep. Possible contributing factors include vitamin B12 and D deficiency. Discussed checking thyroid, vitamin D, and B12 levels, and screening for anemia. - Check thyroid level - Check vitamin D and B12 levels - Screen for anemia  Orders: -     VITAMIN D 25 Hydroxy (Vit-D Deficiency, Fractures) -     TSH Rfx on Abnormal to Free T4 -     Vitamin B12 -     CBC  Vitamin B12 deficiency -     Vitamin B12  Vitamin D deficiency -     VITAMIN D 25 Hydroxy (Vit-D Deficiency, Fractures)  Obesity (BMI 30.0-34.9) -     Hemoglobin A1c  Screening for lipid disorders -     Lipid panel  Screening for endocrine, metabolic and immunity disorder -     Comprehensive metabolic panel -     Hemoglobin A1c  Encounter for hepatitis C screening test for low risk patient -      HCV Ab w Reflex to Quant PCR  Encounter for screening for HIV -     HIV Antibody (routine testing w rflx)  Screening for deficiency anemia -     CBC  Attention deficit disorder (ADD) in adult Assessment & Plan: Currently taking Adderall XR 20 mg for ADHD. Reports increased anxiety when not taking medication. Discussed the need for more frequent follow-up appointments due to Adderall prescription. - Continue Adderall XR 20 mg; PDMP appropriate. - Schedule follow-up when Adderall prescription due for refill; plan for minimum of appointments every 3 months.   Hyperhidrosis Assessment & Plan: Reports experiencing swelling in hands during hotter months, likely related to hyperhidrosis. Discussed monitoring symptoms, especially during hotter months. - Monitor symptoms, especially during hotter months   Situational anxiety Assessment & Plan: Reports increased anxiety, particularly when not taking medication. Currently taking clonazepam as needed for travel and presentations. Discussed continuing current clonazepam prescription for these situations.  PDMP appropriate. - Continue current clonazepam prescription for travel and presentations   Dizziness Reports dizziness upon standing, particularly after squatting or bending. No associated palpitations, chest pain, or other concerning symptoms. Discussed monitoring symptoms and considering further evaluation if dizziness persists or worsens. - Monitor symptoms and consider further evaluation  if dizziness persists or worsens  Postpartum Hypertension (resolved) Had postpartum hypertension but has been well-controlled without medication since. No current treatment needed. - No current treatment needed   Return in about 3 months (around 05/24/2024) for ADHD.     I discussed the assessment and treatment plan with the patient  The patient was provided an opportunity to ask questions and all were answered. The patient agreed with the plan and  demonstrated an understanding of the instructions.   The patient was advised to call back or seek an in-person evaluation if the symptoms worsen or if the condition fails to improve as anticipated.    Sherlyn Hay, DO  [facility] [phone] (phone) [phone] (fax)  [facility]

## 2024-02-22 NOTE — Assessment & Plan Note (Signed)
 Reports experiencing swelling in hands during hotter months, likely related to hyperhidrosis. Discussed monitoring symptoms, especially during hotter months. - Monitor symptoms, especially during hotter months

## 2024-02-22 NOTE — Assessment & Plan Note (Signed)
 Currently taking Adderall XR 20 mg for ADHD. Reports increased anxiety when not taking medication. Discussed the need for more frequent follow-up appointments due to Adderall prescription. - Continue Adderall XR 20 mg; PDMP appropriate. - Schedule follow-up when Adderall prescription due for refill; plan for minimum of appointments every 3 months.

## 2024-02-22 NOTE — Assessment & Plan Note (Signed)
 Reports chronic fatigue despite adequate sleep. Possible contributing factors include vitamin B12 and D deficiency. Discussed checking thyroid, vitamin D, and B12 levels, and screening for anemia. - Check thyroid level - Check vitamin D and B12 levels - Screen for anemia

## 2024-02-22 NOTE — Assessment & Plan Note (Signed)
 Reports increased anxiety, particularly when not taking medication. Currently taking clonazepam as needed for travel and presentations. Discussed continuing current clonazepam prescription for these situations.  PDMP appropriate. - Continue current clonazepam prescription for travel and presentations

## 2024-02-23 LAB — LIPID PANEL
Chol/HDL Ratio: 3 ratio (ref 0.0–4.4)
Cholesterol, Total: 152 mg/dL (ref 100–199)
HDL: 50 mg/dL (ref >39)
LDL Chol Calc (NIH): 85 mg/dL (ref 0–99)
Triglycerides: 93 mg/dL (ref 0–149)
VLDL Cholesterol Cal: 17 mg/dL (ref 5–40)

## 2024-02-23 LAB — COMPREHENSIVE METABOLIC PANEL
ALT: 9 IU/L (ref 0–32)
AST: 11 IU/L (ref 0–40)
Albumin: 4.7 g/dL (ref 3.9–4.9)
Alkaline Phosphatase: 57 IU/L (ref 44–121)
BUN/Creatinine Ratio: 11 (ref 9–23)
BUN: 8 mg/dL (ref 6–20)
Bilirubin Total: 0.8 mg/dL (ref 0.0–1.2)
CO2: 22 mmol/L (ref 20–29)
Calcium: 9.3 mg/dL (ref 8.7–10.2)
Chloride: 105 mmol/L (ref 96–106)
Creatinine, Ser: 0.75 mg/dL (ref 0.57–1.00)
Globulin, Total: 1.8 g/dL (ref 1.5–4.5)
Glucose: 88 mg/dL (ref 70–99)
Potassium: 4.3 mmol/L (ref 3.5–5.2)
Sodium: 139 mmol/L (ref 134–144)
Total Protein: 6.5 g/dL (ref 6.0–8.5)
eGFR: 106 mL/min/{1.73_m2} (ref >59)

## 2024-02-23 LAB — HCV INTERPRETATION

## 2024-02-23 LAB — CBC
Hematocrit: 39.9 % (ref 34.0–46.6)
Hemoglobin: 14.1 g/dL (ref 11.1–15.9)
MCH: 30.5 pg (ref 26.6–33.0)
MCHC: 35.3 g/dL (ref 31.5–35.7)
MCV: 86 fL (ref 79–97)
Platelets: 190 10*3/uL (ref 150–450)
RBC: 4.63 x10E6/uL (ref 3.77–5.28)
RDW: 12.1 % (ref 11.7–15.4)
WBC: 6.2 10*3/uL (ref 3.4–10.8)

## 2024-02-23 LAB — VITAMIN B12: Vitamin B-12: 289 pg/mL (ref 232–1245)

## 2024-02-23 LAB — VITAMIN D 25 HYDROXY (VIT D DEFICIENCY, FRACTURES): Vit D, 25-Hydroxy: 24.8 ng/mL — ABNORMAL LOW (ref 30.0–100.0)

## 2024-02-23 LAB — HEMOGLOBIN A1C
Est. average glucose Bld gHb Est-mCnc: 91 mg/dL
Hgb A1c MFr Bld: 4.8 % (ref 4.8–5.6)

## 2024-02-23 LAB — TSH RFX ON ABNORMAL TO FREE T4: TSH: 0.942 u[IU]/mL (ref 0.450–4.500)

## 2024-02-23 LAB — HIV ANTIBODY (ROUTINE TESTING W REFLEX): HIV Screen 4th Generation wRfx: NONREACTIVE

## 2024-02-23 LAB — HCV AB W REFLEX TO QUANT PCR: HCV Ab: NONREACTIVE

## 2024-02-28 ENCOUNTER — Encounter: Payer: Self-pay | Admitting: Family Medicine

## 2024-02-28 DIAGNOSIS — E538 Deficiency of other specified B group vitamins: Secondary | ICD-10-CM

## 2024-02-28 DIAGNOSIS — E559 Vitamin D deficiency, unspecified: Secondary | ICD-10-CM

## 2024-03-07 MED ORDER — VITAMIN D (ERGOCALCIFEROL) 1.25 MG (50000 UNIT) PO CAPS: 50000.0000 [IU] | ORAL_CAPSULE | ORAL | 1 refills | Status: AC

## 2024-03-07 MED ORDER — VITAMIN B-12 1000 MCG PO TABS: 1000.0000 ug | ORAL_TABLET | Freq: Every day | ORAL | 1 refills | Status: AC

## 2024-03-12 ENCOUNTER — Telehealth: Admitting: Family Medicine

## 2024-03-12 DIAGNOSIS — J069 Acute upper respiratory infection, unspecified: Secondary | ICD-10-CM

## 2024-03-12 MED ORDER — BENZONATATE 100 MG PO CAPS: 100.0000 mg | ORAL_CAPSULE | Freq: Three times a day (TID) | ORAL | 0 refills | Status: DC | PRN

## 2024-03-12 MED ORDER — FLUTICASONE PROPIONATE 50 MCG/ACT NA SUSP: 2.0000 | Freq: Every day | NASAL | 0 refills | Status: DC

## 2024-03-12 NOTE — Progress Notes (Signed)

## 2024-03-30 DIAGNOSIS — F50811 Binge eating disorder, moderate: Secondary | ICD-10-CM | POA: Diagnosis not present

## 2024-03-30 DIAGNOSIS — F909 Attention-deficit hyperactivity disorder, unspecified type: Secondary | ICD-10-CM | POA: Diagnosis not present

## 2024-03-30 DIAGNOSIS — Z6831 Body mass index (BMI) 31.0-31.9, adult: Secondary | ICD-10-CM | POA: Diagnosis not present

## 2024-03-30 DIAGNOSIS — F419 Anxiety disorder, unspecified: Secondary | ICD-10-CM | POA: Diagnosis not present

## 2024-08-07 ENCOUNTER — Encounter: Payer: Self-pay | Admitting: Family Medicine

## 2024-08-07 ENCOUNTER — Ambulatory Visit: Admitting: Family Medicine

## 2024-08-07 VITALS — BP 136/91 | HR 68 | Temp 98.4°F | Ht 66.0 in | Wt 190.5 lb

## 2024-08-07 DIAGNOSIS — E559 Vitamin D deficiency, unspecified: Secondary | ICD-10-CM

## 2024-08-07 DIAGNOSIS — R300 Dysuria: Secondary | ICD-10-CM | POA: Diagnosis not present

## 2024-08-07 DIAGNOSIS — F988 Other specified behavioral and emotional disorders with onset usually occurring in childhood and adolescence: Secondary | ICD-10-CM

## 2024-08-07 DIAGNOSIS — N390 Urinary tract infection, site not specified: Secondary | ICD-10-CM | POA: Diagnosis not present

## 2024-08-07 DIAGNOSIS — E538 Deficiency of other specified B group vitamins: Secondary | ICD-10-CM

## 2024-08-07 DIAGNOSIS — F418 Other specified anxiety disorders: Secondary | ICD-10-CM

## 2024-08-07 DIAGNOSIS — R3989 Other symptoms and signs involving the genitourinary system: Secondary | ICD-10-CM | POA: Diagnosis not present

## 2024-08-07 LAB — POCT URINALYSIS DIPSTICK
Bilirubin, UA: NEGATIVE
Blood, UA: NEGATIVE
Glucose, UA: NEGATIVE
Ketones, UA: NEGATIVE
Nitrite, UA: NEGATIVE
Odor: ABNORMAL
Protein, UA: POSITIVE — AB
Spec Grav, UA: 1.015 (ref 1.010–1.025)
Urobilinogen, UA: 0.2 U/dL
pH, UA: 6 (ref 5.0–8.0)

## 2024-08-07 MED ORDER — SULFAMETHOXAZOLE-TRIMETHOPRIM 800-160 MG PO TABS: 1.0000 | ORAL_TABLET | Freq: Two times a day (BID) | ORAL | 0 refills | Status: DC

## 2024-08-07 MED ORDER — AMPHETAMINE-DEXTROAMPHET ER 20 MG PO CP24: 20.0000 mg | ORAL_CAPSULE | ORAL | 0 refills | Status: AC

## 2024-08-07 MED ORDER — CLONAZEPAM 1 MG PO TABS: 1.0000 mg | ORAL_TABLET | Freq: Every day | ORAL | 1 refills | Status: AC | PRN

## 2024-08-07 MED ORDER — AMPHETAMINE-DEXTROAMPHET ER 20 MG PO CP24
20.0000 mg | ORAL_CAPSULE | Freq: Every morning | ORAL | 0 refills | Status: AC
Start: 2024-08-07 — End: ?

## 2024-08-07 NOTE — Progress Notes (Signed)
 Established patient visit   Patient: Natasha Yang   DOB: 12/06/1986   38 y.o. Female  MRN: [id number] Visit Date: 08/07/2024  Today's healthcare provider: LAURAINE LOISE BUOY, DO   Chief Complaint  Patient presents with   Medical Management of Chronic Issues    Patient presents for follow up of ADHD. Reports feeling well overall.  Needing refills on adderall- must go to [facility] Needs refill on Vitamin D  and clonazepam - can go to [facility]    Urinary Tract Infection    Onset x5 days. Burning with urination, urgency and frequency. Patient is taking AZO an is better but not fully better.   Subjective    Urinary Tract Infection    Last annual exam: 02/22/2024   Natasha Yang is a 38 year old female who presents with recurrent urinary tract infections and follow-up for ADHD.  She experiences recurrent urinary tract infections approximately every other week. She self-treats with AZO pills, which provide temporary relief, but symptoms recur shortly after. She has not been consistent in seeking medical treatment for these episodes. Her last treated UTI was a few months ago, and she has had virtual consultations resulting in prescriptions since then. No back pain, fever, or chills.  She is currently taking Adderall 20 mg extended release, which she feels lasts through her workday but wears off by dinner time. She is inconsistent with daily intake, often skipping doses on weekends to sleep. She has been on this dose for about two years.  She takes clonazepam  occasionally, primarily for anxiety related to flying and public speaking, estimating usage at about twice a week. Additionally, she takes vitamin D  weekly and supplemental vitamin B12 1000 mcg daily.  In her social history, she mentions being sexually active and adheres to preventive measures such as urinating post-intercourse and avoiding douching. She has improved her habits by changing out of gym clothes  promptly and increasing water intake. She recalls receiving the HPV vaccine and believes she received all three doses. She is uncertain about her hepatitis B vaccination status but believes she may have received it during school.      Medications: Outpatient Medications Prior to Visit  Medication Sig   cyanocobalamin  (VITAMIN B12) 1000 MCG tablet Take 1 tablet (1,000 mcg total) by mouth daily.   levonorgestrel  (LILETTA ) 20.1 MCG/DAY IUD IUD 1 each by Intrauterine route once.   Vitamin D , Ergocalciferol , (DRISDOL ) 1.25 MG (50000 UNIT) CAPS capsule Take 1 capsule (50,000 Units total) by mouth every 7 (seven) days.   [DISCONTINUED] amphetamine -dextroamphetamine (ADDERALL XR) 20 MG 24 hr capsule Take 20 mg by mouth every morning.   [DISCONTINUED] clonazePAM  (KLONOPIN ) 1 MG tablet Take 1 mg by mouth daily as needed for anxiety. ONLY TAKES WHEN TRAVELING   [DISCONTINUED] fluticasone  (FLONASE ) 50 MCG/ACT nasal spray Place 2 sprays into both nostrils daily.   [DISCONTINUED] benzonatate  (TESSALON ) 100 MG capsule Take 1 capsule (100 mg total) by mouth 3 (three) times daily as needed for cough. (Patient not taking: Reported on 08/07/2024)   No facility-administered medications prior to visit.        Objective    BP (!) 136/91 (BP Location: Left Arm, Patient Position: Sitting, Cuff Size: Normal)   Pulse 68   Temp 98.4 F (36.9 C) (Oral)   Ht 5' 6 (1.676 m)   Wt 190 lb 8 oz (86.4 kg)   SpO2 100%   BMI 30.75 kg/m     Physical Exam Vitals and nursing  note reviewed.  Constitutional:      General: She is not in acute distress.    Appearance: Normal appearance.  HENT:     Head: Normocephalic and atraumatic.  Eyes:     General: No scleral icterus.    Conjunctiva/sclera: Conjunctivae normal.  Cardiovascular:     Rate and Rhythm: Normal rate.  Pulmonary:     Effort: Pulmonary effort is normal.  Neurological:     Mental Status: She is alert and oriented to person, place, and time. Mental  status is at baseline.  Psychiatric:        Mood and Affect: Mood normal.        Behavior: Behavior normal.      Results for orders placed or performed in visit on 08/07/24  POCT Urinalysis Dipstick  Result Value Ref Range   Color, UA orange    Clarity, UA cloudy    Glucose, UA Negative Negative   Bilirubin, UA negative    Ketones, UA negative    Spec Grav, UA 1.015 1.010 - 1.025   Blood, UA negative    pH, UA 6.0 5.0 - 8.0   Protein, UA Positive (A) Negative   Urobilinogen, UA 0.2 0.2 or 1.0 E.U./dL   Nitrite, UA negative    Leukocytes, UA Trace (A) Negative   Appearance cloudy    Odor abnormal     Assessment & Plan    Attention deficit disorder (ADD) in adult -     Amphetamine -Dextroamphet ER; Take 1 capsule (20 mg total) by mouth every morning.  Dispense: 30 capsule; Refill: 0 -     Amphetamine -Dextroamphet ER; Take 1 capsule (20 mg total) by mouth every morning.  Dispense: 30 capsule; Refill: 0  Suspected UTI -     POCT urinalysis dipstick  Dysuria -     POCT urinalysis dipstick  Recurrent urinary tract infection -     Urine Culture -     Sulfamethoxazole -Trimethoprim ; Take 1 tablet by mouth 2 (two) times daily.  Dispense: 14 tablet; Refill: 0  Vitamin D  deficiency -     VITAMIN D  25 Hydroxy (Vit-D Deficiency, Fractures)  Vitamin B12 deficiency -     Vitamin B12  Situational anxiety -     clonazePAM ; Take 1 tablet (1 mg total) by mouth daily as needed for anxiety. ONLY TAKES WHEN TRAVELING  Dispense: 30 tablet; Refill: 1     Recurrent urinary tract infections Frequent UTIs, currently with dysuria and urinary frequency, with leukocytes and protein on dip test, culture pending. Possible bladder irritation.  From care everywhere, most recent infection for which patient sought in-office treatment confirmed to be Klebsiella. - Prescribe Bactrim . - Send urine culture. - Discussed potential urology referral if UTIs persist. - Encourage follow-up with  gynecologist.  Attention-deficit hyperactivity disorder Adderall 20 mg effective but wears off by dinner. Inconsistent daily dosing. - Continue 20 mg extended-release Adderall. - Send prescription to [facility].  Vitamin D  deficiency On weekly supplement, nearing end of supply. Recheck levels needed before refill. - Order lab work to recheck vitamin D  level. - Provide printout for lab work.  Vitamin B12 deficiency On the OTC supplement. Recheck needed. - Order lab work to recheck vitamin B12 level. - Provide printout for lab work.  Situational anxiety Anxiety related to travel and public speaking, uses Klonopin  as needed. - Continue as-needed use of Klonopin .    Follow up in 4-6 months; ADHD recheck     I discussed the assessment and treatment plan  with the patient  The patient was provided an opportunity to ask questions and all were answered. The patient agreed with the plan and demonstrated an understanding of the instructions.   The patient was advised to call back or seek an in-person evaluation if the symptoms worsen or if the condition fails to improve as anticipated.    LAURAINE LOISE BUOY, DO  [facility] [phone] (phone) [phone] (fax)  [facility]

## 2024-08-11 LAB — SPECIMEN STATUS REPORT

## 2024-08-11 LAB — URINE CULTURE

## 2024-08-13 ENCOUNTER — Ambulatory Visit: Payer: Self-pay | Admitting: Family Medicine

## 2024-09-21 DIAGNOSIS — M67911 Unspecified disorder of synovium and tendon, right shoulder: Secondary | ICD-10-CM | POA: Diagnosis not present

## 2024-10-10 DIAGNOSIS — M25511 Pain in right shoulder: Secondary | ICD-10-CM | POA: Diagnosis not present

## 2024-10-11 ENCOUNTER — Telehealth: Admitting: Physician Assistant

## 2024-10-11 DIAGNOSIS — B9789 Other viral agents as the cause of diseases classified elsewhere: Secondary | ICD-10-CM | POA: Diagnosis not present

## 2024-10-11 DIAGNOSIS — J019 Acute sinusitis, unspecified: Secondary | ICD-10-CM | POA: Diagnosis not present

## 2024-10-11 MED ORDER — FLUTICASONE PROPIONATE 50 MCG/ACT NA SUSP: 2.0000 | Freq: Every day | NASAL | 0 refills | Status: AC

## 2024-10-11 NOTE — Progress Notes (Signed)
 We are sorry that you are not feeling well.  Here is how we plan to help!  Based on what you have shared with me it looks like you have sinusitis.  Sinusitis is inflammation and infection in the sinus cavities of the head.  Based on your presentation I believe you most likely have an acute sinusitis secondary to viral illness and likely some allergic inflammation as well. There is not specific treatment for viral sinusitis other than to help you with the symptoms until the infection runs its course.  You may use an oral decongestant such as Mucinex D or if you have glaucoma or high blood pressure use plain Mucinex. Saline nasal spray help and can safely be used as often as needed for congestion, I have prescribed: Fluticasone  nasal spray two sprays in each nostril once a day  Some authorities believe that zinc sprays or the use of Echinacea may shorten the course of your symptoms.  Sinus infections are not as easily transmitted as other respiratory infection, however we still recommend that you avoid close contact with loved ones, especially the very young and elderly.  Remember to wash your hands thoroughly throughout the day as this is the number one way to prevent the spread of infection!  Home Care: Only take medications as instructed by your medical team. Do not take these medications with alcohol. A steam or ultrasonic humidifier can help congestion.  You can place a towel over your head and breathe in the steam from hot water coming from a faucet. Avoid close contacts especially the very young and the elderly. Cover your mouth when you cough or sneeze. Always remember to wash your hands.  Get Help Right Away If: You develop worsening fever or sinus pain. You develop a severe head ache or visual changes. Your symptoms persist after you have completed your treatment plan.  Make sure you Understand these instructions. Will watch your condition. Will get help right away if you are not  doing well or get worse.  Your e-visit answers were reviewed by a board certified advanced clinical practitioner to complete your personal care plan.  Depending on the condition, your plan could have included both over the counter or prescription medications.  If there is a problem please reply  once you have received a response from your provider.  Your safety is important to us .  If you have drug allergies check your prescription carefully.    You can use MyChart to ask questions about today's visit, request a non-urgent call back, or ask for a work or school excuse for 24 hours related to this e-Visit. If it has been greater than 24 hours you will need to follow up with your provider, or enter a new e-Visit to address those concerns.  You will get an e-mail in the next two days asking about your experience.  I hope that your e-visit has been valuable and will speed your recovery. Thank you for using e-visits.  I have spent 5 minutes in review of e-visit questionnaire, review and updating patient chart, medical decision making and response to patient.   Elsie Velma Lunger, PA-C

## 2024-10-26 DIAGNOSIS — M25511 Pain in right shoulder: Secondary | ICD-10-CM | POA: Diagnosis not present

## 2024-10-29 DIAGNOSIS — R399 Unspecified symptoms and signs involving the genitourinary system: Secondary | ICD-10-CM | POA: Diagnosis not present

## 2024-10-29 DIAGNOSIS — Z1331 Encounter for screening for depression: Secondary | ICD-10-CM | POA: Diagnosis not present

## 2024-10-29 DIAGNOSIS — N898 Other specified noninflammatory disorders of vagina: Secondary | ICD-10-CM | POA: Diagnosis not present

## 2024-10-29 DIAGNOSIS — Z01411 Encounter for gynecological examination (general) (routine) with abnormal findings: Secondary | ICD-10-CM | POA: Diagnosis not present

## 2024-11-05 DIAGNOSIS — M75121 Complete rotator cuff tear or rupture of right shoulder, not specified as traumatic: Secondary | ICD-10-CM | POA: Diagnosis not present

## 2024-11-13 DIAGNOSIS — R399 Unspecified symptoms and signs involving the genitourinary system: Secondary | ICD-10-CM | POA: Diagnosis not present

## 2024-11-20 DIAGNOSIS — E538 Deficiency of other specified B group vitamins: Secondary | ICD-10-CM | POA: Diagnosis not present

## 2024-11-20 DIAGNOSIS — E559 Vitamin D deficiency, unspecified: Secondary | ICD-10-CM | POA: Diagnosis not present

## 2024-11-21 LAB — VITAMIN D 25 HYDROXY (VIT D DEFICIENCY, FRACTURES): Vit D, 25-Hydroxy: 41.4 ng/mL (ref 30.0–100.0)

## 2024-11-21 LAB — VITAMIN B12: Vitamin B-12: 249 pg/mL (ref 232–1245)

## 2024-12-05 NOTE — Addendum Note (Signed)
 Addended by: DONZELLA DOMINO on: 12/05/2024 08:12 AM   Modules accepted: Orders

## 2024-12-06 DIAGNOSIS — N390 Urinary tract infection, site not specified: Secondary | ICD-10-CM | POA: Diagnosis not present

## 2024-12-07 ENCOUNTER — Encounter: Payer: Self-pay | Admitting: Family Medicine

## 2024-12-07 ENCOUNTER — Ambulatory Visit: Admitting: Family Medicine

## 2024-12-07 VITALS — BP 136/93 | HR 75 | Temp 98.8°F | Ht 67.0 in | Wt 181.8 lb

## 2024-12-07 DIAGNOSIS — B9689 Other specified bacterial agents as the cause of diseases classified elsewhere: Secondary | ICD-10-CM

## 2024-12-07 DIAGNOSIS — F988 Other specified behavioral and emotional disorders with onset usually occurring in childhood and adolescence: Secondary | ICD-10-CM | POA: Diagnosis not present

## 2024-12-07 DIAGNOSIS — F418 Other specified anxiety disorders: Secondary | ICD-10-CM | POA: Diagnosis not present

## 2024-12-07 DIAGNOSIS — E538 Deficiency of other specified B group vitamins: Secondary | ICD-10-CM

## 2024-12-07 DIAGNOSIS — J019 Acute sinusitis, unspecified: Secondary | ICD-10-CM | POA: Diagnosis not present

## 2024-12-07 DIAGNOSIS — E559 Vitamin D deficiency, unspecified: Secondary | ICD-10-CM | POA: Diagnosis not present

## 2024-12-07 MED ORDER — AMPHETAMINE-DEXTROAMPHET ER 20 MG PO CP24: 20.0000 mg | ORAL_CAPSULE | ORAL | 0 refills | Status: AC

## 2024-12-07 MED ORDER — AZITHROMYCIN 250 MG PO TABS: ORAL_TABLET | ORAL | 0 refills | Status: AC

## 2024-12-07 MED ORDER — AMPHETAMINE-DEXTROAMPHET ER 20 MG PO CP24: 20.0000 mg | ORAL_CAPSULE | Freq: Every morning | ORAL | 0 refills | Status: AC

## 2024-12-07 MED ORDER — CLONAZEPAM 1 MG PO TABS: 1.0000 mg | ORAL_TABLET | Freq: Every day | ORAL | 1 refills | Status: AC | PRN

## 2024-12-07 NOTE — Patient Instructions (Addendum)
 Recommend calcium 1200 mg daily, 2000 units vitamin D  and 40 mcg vitamin K2 daily. Recommend vitamin B12 2000 mcg daily.

## 2024-12-07 NOTE — Progress Notes (Signed)
 "     Established patient visit   Patient: Natasha Yang   DOB: [birth year]   38 y.o. Female  MRN: [id number] Visit Date: 12/07/2024  Today's healthcare provider: LAURAINE LOISE BUOY, DO   Chief Complaint  Patient presents with   Follow-up    Patient is here for a 4 month follow up states that she had labs for Vitamin D  and Vitamin B at last visit and wondering if she needs continue taking the medications.  Also reports she seems to have a little bit of a cold with cough, feeling some sinus issues.  Primarily in the mornings she is coughing green phelgm not so much throughout the day.  Concerned if she needs to take something OTC or be prescribed something as she is having surgery on Tuesday on her shoulder.    Subjective    HPI Natasha Yang is a 38 year old female who presents with sinus issues and concerns about vitamin deficiencies.  She has been experiencing sinus issues since Sunday, prior to her trip on Monday. Symptoms include a lingering cough, worse at night and in the morning, and a sensation of coughing up phlegm in the morning. She also notes a sore throat and a tickling sensation in her ears, but no ear pain. Sinus pressure is primarily in the upper facial area. She has been taking over-the-counter Sudafed without significant relief. No fever, chills, or shortness of breath.  She has a history of low vitamin D  and B12 levels. She had been taking vitamin D  as a prescription and vitamin B12 orally at a dose of 1000 mcg, but stopped both around September when she ran out. She is concerned about her immune system, feeling 'sick all the time' and not taking any multivitamins or supplements regularly. She does not consume calcium supplements or have a calcium-rich diet.  She exercises regularly with a personal trainer, engaging in high-intensity workouts, weight lifting, and cardio from Monday to Thursday. She does not smoke and works from home, limiting her exposure to others,  but often gets sick after traveling for work or vacation.  She is currently taking clonazepam  and Adderall, but does not take them daily. Clonazepam  is used for work-related and social anxiety, and Adderall helps with focus when taken. She acknowledges being 'terrible with pills' and often forgets to take her medications.      Medications: Show/hide medication list[1]       Objective    BP (!) 136/93 (BP Location: Right Arm, Patient Position: Sitting, Cuff Size: Normal)   Pulse 75   Temp 98.8 F (37.1 C) (Oral)   Ht 5' 7 (1.702 m)   Wt 181 lb 12.8 oz (82.5 kg)   SpO2 99%   BMI 28.47 kg/m     Physical Exam Vitals reviewed.  Constitutional:      General: She is not in acute distress.    Appearance: Normal appearance. She is well-developed. She is not diaphoretic.  HENT:     Head: Normocephalic and atraumatic.     Right Ear: Tympanic membrane, ear canal and external ear normal.     Left Ear: Tympanic membrane, ear canal and external ear normal.     Nose: Nose normal.     Mouth/Throat:     Mouth: Mucous membranes are moist.     Pharynx: Oropharynx is clear. No oropharyngeal exudate.  Eyes:     General: No scleral icterus.    Conjunctiva/sclera: Conjunctivae normal.     Pupils: Pupils  are equal, round, and reactive to light.  Cardiovascular:     Rate and Rhythm: Normal rate and regular rhythm.     Pulses: Normal pulses.     Heart sounds: Normal heart sounds. No murmur heard. Pulmonary:     Effort: Pulmonary effort is normal. No respiratory distress.     Breath sounds: Normal breath sounds. No wheezing or rales.  Musculoskeletal:     Cervical back: Neck supple.     Right lower leg: No edema.     Left lower leg: No edema.  Lymphadenopathy:     Cervical: No cervical adenopathy.  Skin:    General: Skin is warm and dry.     Findings: No rash.  Neurological:     Mental Status: She is alert.      No results found for any visits on 12/07/24.  Assessment & Plan     Acute bacterial sinusitis -     Azithromycin ; Take 2 tablets on day 1, then 1 tablet daily on days 2 through 5  Dispense: 6 tablet; Refill: 0  Vitamin B12 deficiency  Vitamin D  deficiency  Attention deficit disorder (ADD) in adult -     Amphetamine -Dextroamphet ER; Take 1 capsule (20 mg total) by mouth every morning.  Dispense: 30 capsule; Refill: 0 -     Amphetamine -Dextroamphet ER; Take 1 capsule (20 mg total) by mouth every morning.  Dispense: 30 capsule; Refill: 0 -     Amphetamine -Dextroamphet ER; Take 1 capsule (20 mg total) by mouth every morning.  Dispense: 30 capsule; Refill: 0  Situational anxiety -     clonazePAM ; Take 1 tablet (1 mg total) by mouth daily as needed for anxiety. ONLY TAKES WHEN TRAVELING  Dispense: 30 tablet; Refill: 1     Acute bacterial sinusitis Sinusitis with persistent symptoms unresponsive to supportive care. Concern for surgical complications. - Prescribed azithromycin .  Vitamin B12 deficiency B12 levels decreased since March. Symptoms of fatigue and potential neuropathy discussed. - Recommended resuming vitamin B12 supplementation. - Plan to recheck B12 levels in six months.  Vitamin D  deficiency Vitamin D  levels improved but still within normal limits. Discussed supplementation to prevent osteoporosis. - Recommended vitamin D  supplementation, 2000 units daily or 5000 units every couple of days. - Consider vitamin D  with K2 supplementation for better absorption.  Attention deficit disorder (ADD) in adult Inconsistent adherence to Adderall affecting focus. Emphasized consistent use for effectiveness. - Sent prescription for Adderall, three-month supply with one refill.  Situational anxiety Uses clonazepam  for anxiety. Discussed potential impact on blood pressure readings. - Continue clonazepam  as needed.  General health maintenance Emphasized importance of multivitamins and calcium for health. Advised mask use during travel. -  Recommended multivitamin. - Advised wearing a mask during travel.    Return in about 3 months (around 03/07/2025) for CPE, ADHD w/next provider.      I discussed the assessment and treatment plan with the patient  The patient was provided an opportunity to ask questions and all were answered. The patient agreed with the plan and demonstrated an understanding of the instructions.   The patient was advised to call back or seek an in-person evaluation if the symptoms worsen or if the condition fails to improve as anticipated.    LAURAINE LOISE BUOY, DO  [facility] [phone] (phone) [phone] (fax)  [facility]    [1]  Outpatient Medications Prior to Visit  Medication Sig Note   cyanocobalamin  (VITAMIN B12) 1000 MCG tablet Take  1 tablet (1,000 mcg total) by mouth daily.    levonorgestrel  (LILETTA ) 20.1 MCG/DAY IUD IUD 1 each by Intrauterine route once.    [DISCONTINUED] amphetamine -dextroamphetamine (ADDERALL XR) 20 MG 24 hr capsule Take 1 capsule (20 mg total) by mouth every morning.    [DISCONTINUED] amphetamine -dextroamphetamine (ADDERALL XR) 20 MG 24 hr capsule Take 1 capsule (20 mg total) by mouth every morning.    [DISCONTINUED] clonazePAM  (KLONOPIN ) 1 MG tablet Take 1 tablet (1 mg total) by mouth daily as needed for anxiety. ONLY TAKES WHEN TRAVELING    Vitamin D , Ergocalciferol , (DRISDOL ) 1.25 MG (50000 UNIT) CAPS capsule Take 1 capsule (50,000 Units total) by mouth every 7 (seven) days. (Patient not taking: Reported on 12/07/2024)    [DISCONTINUED] fluticasone  (FLONASE ) 50 MCG/ACT nasal spray Place 2 sprays into both nostrils daily. 12/07/2024: States that she does not use medication at all.   No facility-administered medications prior to visit.   "

## 2024-12-11 DIAGNOSIS — M7541 Impingement syndrome of right shoulder: Secondary | ICD-10-CM | POA: Diagnosis not present

## 2024-12-11 DIAGNOSIS — G8918 Other acute postprocedural pain: Secondary | ICD-10-CM | POA: Diagnosis not present

## 2024-12-11 DIAGNOSIS — M778 Other enthesopathies, not elsewhere classified: Secondary | ICD-10-CM | POA: Diagnosis not present

## 2024-12-11 DIAGNOSIS — M75121 Complete rotator cuff tear or rupture of right shoulder, not specified as traumatic: Secondary | ICD-10-CM | POA: Diagnosis not present

## 2024-12-11 DIAGNOSIS — M25811 Other specified joint disorders, right shoulder: Secondary | ICD-10-CM | POA: Diagnosis not present

## 2025-03-08 ENCOUNTER — Encounter
